# Patient Record
Sex: Female | Born: 1970 | Race: White | Hispanic: No | Marital: Single | Smoking: Former smoker
Health system: Southern US, Community
[De-identification: ages and names within clinical notes are randomized; demographics above are authoritative.]

## PROBLEM LIST (undated history)

## (undated) DIAGNOSIS — N959 Unspecified menopausal and perimenopausal disorder: Secondary | ICD-10-CM

## (undated) DIAGNOSIS — J329 Chronic sinusitis, unspecified: Secondary | ICD-10-CM

## (undated) DIAGNOSIS — G5620 Lesion of ulnar nerve, unspecified upper limb: Secondary | ICD-10-CM

## (undated) DIAGNOSIS — F419 Anxiety disorder, unspecified: Secondary | ICD-10-CM

## (undated) HISTORY — PX: TONSILLECTOMY AND ADENOIDECTOMY: SUR1326

## (undated) HISTORY — DX: Unspecified menopausal and perimenopausal disorder: N95.9

## (undated) HISTORY — DX: Lesion of ulnar nerve, unspecified upper limb: G56.20

## (undated) HISTORY — DX: Chronic sinusitis, unspecified: J32.9

## (undated) HISTORY — DX: Anxiety disorder, unspecified: F41.9

---

## 2000-01-10 ENCOUNTER — Inpatient Hospital Stay (HOSPITAL_COMMUNITY): Admission: AD | Admit: 2000-01-10 | Discharge: 2000-01-15 | Payer: Self-pay | Admitting: Obstetrics & Gynecology

## 2000-01-23 ENCOUNTER — Encounter: Admission: RE | Admit: 2000-01-23 | Discharge: 2000-03-23 | Payer: Self-pay | Admitting: Obstetrics and Gynecology

## 2000-02-12 ENCOUNTER — Other Ambulatory Visit: Admission: RE | Admit: 2000-02-12 | Discharge: 2000-02-12 | Payer: Self-pay | Admitting: Obstetrics and Gynecology

## 2002-09-03 ENCOUNTER — Other Ambulatory Visit: Admission: RE | Admit: 2002-09-03 | Discharge: 2002-09-03 | Payer: Self-pay | Admitting: Obstetrics and Gynecology

## 2003-04-11 ENCOUNTER — Inpatient Hospital Stay (HOSPITAL_COMMUNITY): Admission: RE | Admit: 2003-04-11 | Discharge: 2003-04-15 | Payer: Self-pay | Admitting: Obstetrics and Gynecology

## 2003-04-16 ENCOUNTER — Encounter: Admission: RE | Admit: 2003-04-16 | Discharge: 2003-05-16 | Payer: Self-pay | Admitting: Obstetrics and Gynecology

## 2003-05-14 ENCOUNTER — Other Ambulatory Visit: Admission: RE | Admit: 2003-05-14 | Discharge: 2003-05-14 | Payer: Self-pay | Admitting: Obstetrics and Gynecology

## 2006-10-20 DIAGNOSIS — Z8614 Personal history of Methicillin resistant Staphylococcus aureus infection: Secondary | ICD-10-CM | POA: Insufficient documentation

## 2009-12-11 ENCOUNTER — Ambulatory Visit: Payer: Self-pay

## 2014-11-19 ENCOUNTER — Other Ambulatory Visit: Payer: Self-pay | Admitting: Family Medicine

## 2014-11-19 DIAGNOSIS — F419 Anxiety disorder, unspecified: Secondary | ICD-10-CM | POA: Insufficient documentation

## 2014-11-19 MED ORDER — CLONAZEPAM 1 MG PO TABS: 1.0000 mg | ORAL_TABLET | Freq: Two times a day (BID) | ORAL | Status: DC

## 2014-11-19 NOTE — Telephone Encounter (Signed)
OK to call in rx. Thanks.  

## 2014-11-19 NOTE — Telephone Encounter (Signed)
Pt needs refill generic xaxan.  [facility].    Call back is [phone]  Thanks Barth Kirks

## 2014-11-19 NOTE — Telephone Encounter (Signed)
Pt is really taking Clonazepam .  Last OV 06/2014 with Nadine Counts  Thanks,   -Vernona Rieger

## 2014-12-11 DIAGNOSIS — G250 Essential tremor: Secondary | ICD-10-CM | POA: Insufficient documentation

## 2014-12-13 DIAGNOSIS — M542 Cervicalgia: Secondary | ICD-10-CM | POA: Insufficient documentation

## 2015-05-16 ENCOUNTER — Other Ambulatory Visit: Payer: Self-pay | Admitting: Family Medicine

## 2015-05-16 DIAGNOSIS — F419 Anxiety disorder, unspecified: Secondary | ICD-10-CM

## 2015-05-16 MED ORDER — CLONAZEPAM 1 MG PO TABS: 1.0000 mg | ORAL_TABLET | Freq: Two times a day (BID) | ORAL | Status: DC

## 2015-05-16 NOTE — Telephone Encounter (Signed)
Last OV 06/2014  Thanks,   -Claudia Moses  

## 2015-05-16 NOTE — Telephone Encounter (Signed)
Printed, please fax or call in to pharmacy. Thank you.   

## 2015-05-16 NOTE — Telephone Encounter (Signed)
Pt needs refill onazePAM (KLONOPIN) 1 MG tablet 11/19/14 -- Lorie PhenixNancy Maloney, MD   [facility] [address]  Her call back is [phone]  [facility]

## 2015-05-29 ENCOUNTER — Telehealth: Payer: Self-pay

## 2015-05-29 NOTE — Telephone Encounter (Signed)
Patient called saying that for the past 4-5 days, she has experienced heart palpitations that is becoming more frequent. Patient denies and chest pain, shortness of breath, dizziness, or lightheadedness. Patient reports that her symptoms come and go. She also reports that she has been out of her anxiety medication for over 2 weeks. She thinks that her symptoms could be anxiety related. Patient has scheduled an appt for tomorrow to be evaluated. Advised patient that if symptoms worsen, she would need to go to the ER. Patient verbalized understanding.

## 2015-05-30 ENCOUNTER — Ambulatory Visit (INDEPENDENT_AMBULATORY_CARE_PROVIDER_SITE_OTHER): Payer: Self-pay | Admitting: Family Medicine

## 2015-05-30 ENCOUNTER — Encounter: Payer: Self-pay | Admitting: Family Medicine

## 2015-05-30 VITALS — BP 96/60 | HR 68 | Temp 98.0°F | Resp 16 | Ht 62.5 in | Wt 135.6 lb

## 2015-05-30 DIAGNOSIS — G4700 Insomnia, unspecified: Secondary | ICD-10-CM

## 2015-05-30 DIAGNOSIS — R42 Dizziness and giddiness: Secondary | ICD-10-CM

## 2015-05-30 DIAGNOSIS — G5620 Lesion of ulnar nerve, unspecified upper limb: Secondary | ICD-10-CM | POA: Insufficient documentation

## 2015-05-30 DIAGNOSIS — N959 Unspecified menopausal and perimenopausal disorder: Secondary | ICD-10-CM | POA: Insufficient documentation

## 2015-05-30 DIAGNOSIS — J0100 Acute maxillary sinusitis, unspecified: Secondary | ICD-10-CM

## 2015-05-30 MED ORDER — DOXYCYCLINE HYCLATE 100 MG PO TABS: 100.0000 mg | ORAL_TABLET | Freq: Two times a day (BID) | ORAL | Status: DC

## 2015-05-30 MED ORDER — HYDROCODONE-HOMATROPINE 5-1.5 MG/5ML PO SYRP: ORAL_SOLUTION | ORAL | Status: DC

## 2015-05-30 NOTE — Addendum Note (Signed)
Addended by: Jaclyn PrimeHAUVIN, Kathee Tumlin S on: 05/30/2015 10:10 AM   Modules accepted: SmartSet

## 2015-05-30 NOTE — Patient Instructions (Addendum)
Stop Mucinex D and use plain Mucinex. Also may use Delsym for cough. For improved sleep: 1.Keep the same bedtime every night 2.Use your bed for sleep and sex only 3. Avoid exercise or stimulating electronic activity (video games/smart phones) prior to bedtime 4.Avoid alcohol or caffeine 2-3 hours before bedtime 5.Take a bath/shower prior to bedtime. This will help cool your body down and prepare it for sleep. 6.If you can't sleep, get up and do a sleep inducing activity like reading,

## 2015-05-30 NOTE — Progress Notes (Addendum)
Subjective:     Patient ID: Claudia Moses, female   DOB: [birth year], 45 y.o.   MRN: [id number]  HPI  Chief Complaint  Patient presents with  . Palpitations    Patient comes in office today with concerns of irregular heart beat for the past week. Patient states " it feel like heaviness in my chest then gets light, like Im standing on my head." Patient reports last episode happened yesteday while sitting, patient denies doing any strenous activity or being under any more stress than usual. Patient denies symptoms of shortness of breath or any past history of palpitations.    Marland Kitchen. URI    Patient has concerns of cold like symptoms for the past week. Patient reports that she has had congestion post nasal drip, ear congestion and sinus pain/pressure. Patient has been taking otc Mucinex and Ibuprofen for relief.   . Insomnia    Patient would like to address insomnia that she has been dealing with for the past 3-4years, she states that she takes Advil PM to help with sleep. Patient reports if she does not taking sleeping aid she averages 2-3hrs of sleep at night  Reports developing cold symptoms 7 days ago. States she had two days of feeling better then the sinus congestion returned along with PND and cough. Has been taking Mucinex D but denies palpitations. Reports that she has had seconds long episodes of feeling chest heaviness then light headed. Caffeine consumption is one cup of coffee daily with an occasional soda.  Review of Systems  Psychiatric/Behavioral: Positive for sleep disturbance (states she primarily has trouble getting to sleep. Denies rumination/anxiety states she will just toss and turn.).       Objective:   Physical Exam  Constitutional: She appears well-developed and well-nourished. No distress.  Ears: T.M's intact without inflammation Throat: tonsils absent Neck: no cervical adenopathy Lungs: clear Heart: RRR without murmur in the 60's(  Auscultated for one minute)      Assessment:    1. Acute maxillary sinusitis, recurrence not specified - doxycycline (VIBRA-TABS) 100 MG tablet; Take 1 tablet (100 mg total) by mouth 2 (two) times daily.  Dispense: 20 tablet; Refill: 0 - HYDROcodone-homatropine (HYCODAN) 5-1.5 MG/5ML syrup; 5 ml 4-6 hours as needed for cough  Dispense: 240 mL; Refill: 0  2. Light headed: ? Due to illness and/or decongestant medication  3. Insomnia    Plan:    Discussed sleep hygiene. Will consider medication at next office visit. Stop decongestants.

## 2015-06-13 ENCOUNTER — Ambulatory Visit (INDEPENDENT_AMBULATORY_CARE_PROVIDER_SITE_OTHER): Payer: Self-pay | Admitting: Family Medicine

## 2015-06-13 ENCOUNTER — Encounter: Payer: Self-pay | Admitting: Family Medicine

## 2015-06-13 VITALS — BP 96/68 | HR 58 | Temp 98.0°F | Resp 16 | Wt 133.4 lb

## 2015-06-13 DIAGNOSIS — F419 Anxiety disorder, unspecified: Secondary | ICD-10-CM

## 2015-06-13 DIAGNOSIS — N200 Calculus of kidney: Secondary | ICD-10-CM | POA: Insufficient documentation

## 2015-06-13 DIAGNOSIS — G4700 Insomnia, unspecified: Secondary | ICD-10-CM

## 2015-06-13 MED ORDER — NORTRIPTYLINE HCL 10 MG PO CAPS: ORAL_CAPSULE | ORAL | Status: DC

## 2015-06-13 NOTE — Patient Instructions (Addendum)
Discussed increased water intake on a daily basis. Phone follow up about how you are doing on the sleep medication. I will call you when I receive the emergency room records.

## 2015-06-13 NOTE — Progress Notes (Addendum)
Subjective:     Patient ID: Claudia BestLisa R Mclear, female   DOB: 08-11-1970, 45 y.o.   MRN: [id number]  HPI  Chief Complaint  Patient presents with  . Insomnia    Patient returns to office for 2 week follow up, patient was last seen 05/30/15. At last visit we discussed sleep hygeine, patient reports that she has been taking Advil PM and that since last visit there has been no improvement with sleep cycle.   States she continues to have trouble getting to sleep. Since we last saw her she has passed a right kidney stone while on vacation. [facility] [place] Was informed that she had 3 other stones in her kidney. Placed on oxycodone and Flomax. Passed stone was 3mm. No prior hx of kidney stones.   Review of Systems  HENT:       Sinus infection has cleared from prior office visit.       Objective:   Physical Exam  Constitutional: She appears well-developed and well-nourished. No distress.       Assessment:    1. Bilateral kidney stones: Acquire hospital records  2. Insomnia - nortriptyline (PAMELOR) 10 MG capsule; One to three at bedtime  Dispense: 30 capsule; Refill: 0    Plan:    Phone f/u regarding sleep medication. Further f/u after hospital records reviewed. Urine strainer provided.

## 2015-10-16 ENCOUNTER — Other Ambulatory Visit: Payer: Self-pay | Admitting: Family Medicine

## 2015-10-16 DIAGNOSIS — F419 Anxiety disorder, unspecified: Secondary | ICD-10-CM

## 2015-10-16 MED ORDER — CLONAZEPAM 1 MG PO TABS: 1.0000 mg | ORAL_TABLET | Freq: Two times a day (BID) | ORAL | 1 refills | Status: DC

## 2015-10-16 NOTE — Telephone Encounter (Signed)
Pt contacted office for refill request on the following medications: clonazePAM (KLONOPIN) 1 MG tablet  [facility] [address] Last written: 05/16/15 Last OV: 06/13/15 (with Nadine CountsBob) Pt was seeing Dr. Elease HashimotoMaloney but since she has seen Nadine CountsBob in the past she requested the message be sent to [facility]. Please advise. Thanks TNP

## 2015-10-16 NOTE — Telephone Encounter (Signed)
Last refill was 05/16/2015. Allene DillonEmily Drozdowski, CMA

## 2015-10-16 NOTE — Telephone Encounter (Signed)
Called in. Emily Drozdowski, CMA  

## 2016-01-14 ENCOUNTER — Encounter: Payer: Self-pay | Admitting: Family Medicine

## 2016-01-14 ENCOUNTER — Ambulatory Visit (INDEPENDENT_AMBULATORY_CARE_PROVIDER_SITE_OTHER): Payer: Self-pay | Admitting: Family Medicine

## 2016-01-14 ENCOUNTER — Telehealth: Payer: Self-pay | Admitting: Family Medicine

## 2016-01-14 VITALS — BP 96/58 | HR 68 | Temp 98.3°F | Resp 16 | Wt 142.0 lb

## 2016-01-14 DIAGNOSIS — J40 Bronchitis, not specified as acute or chronic: Secondary | ICD-10-CM

## 2016-01-14 MED ORDER — AZITHROMYCIN 250 MG PO TABS: ORAL_TABLET | ORAL | 0 refills | Status: DC

## 2016-01-14 MED ORDER — HYDROCODONE-HOMATROPINE 5-1.5 MG/5ML PO SYRP: ORAL_SOLUTION | ORAL | 0 refills | Status: DC

## 2016-01-14 NOTE — Telephone Encounter (Signed)
Pt stated she found the Rx it had fallen in between her seats of her car. Thanks TNP

## 2016-01-14 NOTE — Patient Instructions (Signed)
Let me know if you are not improving with treatment.

## 2016-01-14 NOTE — Telephone Encounter (Signed)
Please review-aa 

## 2016-01-14 NOTE — Telephone Encounter (Signed)
Pt stated that when she was in the office this morning and saw Nadine CountsBob she was given an Rx for HYDROcodone-homatropine (HYCODAN) 5-1.5 MG/5ML syrup. Pt stated [facility] her other medication that was called in and she couldn't find this Rx to get filled. Pt stated that the last time she remembered having it was at check out. I didn't see the Rx up front. Pt request that the Rx be reprinted and she would call back if she found it. Pt was advised that Nadine CountsBob has already left for the day and we would send the request to another provider in the office. Please advise. Thanks TNP

## 2016-01-14 NOTE — Progress Notes (Signed)
Subjective:     Patient ID: Claudia BestLisa R Moses, female   DOB: [birth year], 45 y.o.   MRN: [id number]  HPI  Chief Complaint  Patient presents with  . Cough    x 2-3 weeks. Dry cough. Started with sinus infection, which has improved but now pt is experiencing lingering cough. Is also c/o chest tightness and nausea. Afebrile.  States cough is minimally productive and worse in the AM. Reports paroxysms of coughing.   Review of Systems  Constitutional: Negative for chills and fever.      Objective:   Physical Exam  Constitutional: She appears well-developed and well-nourished. No distress.  Ears: T.M's intact without inflammation Throat: tonsils absent Neck: no cervical adenopathy Lungs: clear     Assessment:    1. Bronchitis - HYDROcodone-homatropine (HYCODAN) 5-1.5 MG/5ML syrup; 5 ml 4-6 hours as needed for cough  Dispense: 240 mL; Refill: 0 - azithromycin (ZITHROMAX Z-PAK) 250 MG tablet; Take two pills the first day then one pill daily  Dispense: 6 each; Refill: 0    Plan:    Further f/u if not improving. Discussed getting Tdap and flu vaccine when better

## 2016-04-09 ENCOUNTER — Telehealth: Payer: Self-pay | Admitting: Family Medicine

## 2016-04-09 ENCOUNTER — Other Ambulatory Visit: Payer: Self-pay | Admitting: Family Medicine

## 2016-04-09 DIAGNOSIS — F419 Anxiety disorder, unspecified: Secondary | ICD-10-CM

## 2016-04-09 MED ORDER — CLONAZEPAM 1 MG PO TABS: 1.0000 mg | ORAL_TABLET | Freq: Two times a day (BID) | ORAL | 1 refills | Status: DC

## 2016-04-09 NOTE — Progress Notes (Signed)
rx called in-aa 

## 2016-04-09 NOTE — Telephone Encounter (Signed)
Please review-aa 

## 2016-04-09 NOTE — Telephone Encounter (Signed)
Pt needs refill on her   clonazePAM (KLONOPIN) 1 MG tablet  Taking 10/16/15 -- Anola Gurneyobert Chauvin, PA    Take 1 tablet (1 mg total) by mouth 2 (two) times      [facility] [address]  Thanks Barth Kirkseri

## 2016-04-09 NOTE — Telephone Encounter (Signed)
Clonazepam refilled 

## 2016-08-12 ENCOUNTER — Telehealth: Payer: Self-pay | Admitting: *Deleted

## 2016-08-12 NOTE — Telephone Encounter (Signed)
Pt states she thinks she has a toenail fungus and wants to know how to treat. I told pt the best way to find out how to treat toenail fungus is to find out what kind of fungus needs treating. Pt states understanding and I transferred to schedulers.

## 2016-08-23 ENCOUNTER — Other Ambulatory Visit: Payer: Self-pay | Admitting: Family Medicine

## 2016-08-23 DIAGNOSIS — F419 Anxiety disorder, unspecified: Secondary | ICD-10-CM

## 2016-08-23 MED ORDER — CLONAZEPAM 1 MG PO TABS: 1.0000 mg | ORAL_TABLET | Freq: Two times a day (BID) | ORAL | 1 refills | Status: DC

## 2016-08-23 NOTE — Telephone Encounter (Signed)
rx called in-aa 

## 2016-08-23 NOTE — Telephone Encounter (Signed)
Please call in clonazepam as requested with one refill.

## 2016-08-23 NOTE — Telephone Encounter (Signed)
Pt contacted office for refill request on the following medications:  clonazePAM (KLONOPIN) 1 MG tablet.  [facility] [address]  [phone]#[phone]/MW

## 2016-10-07 ENCOUNTER — Encounter: Payer: Self-pay | Admitting: Physician Assistant

## 2016-10-11 ENCOUNTER — Ambulatory Visit (INDEPENDENT_AMBULATORY_CARE_PROVIDER_SITE_OTHER): Payer: Self-pay | Admitting: Physician Assistant

## 2016-10-11 ENCOUNTER — Encounter: Payer: Self-pay | Admitting: Physician Assistant

## 2016-10-11 VITALS — BP 92/62 | HR 70 | Temp 98.2°F | Wt 137.6 lb

## 2016-10-11 DIAGNOSIS — K219 Gastro-esophageal reflux disease without esophagitis: Secondary | ICD-10-CM

## 2016-10-11 DIAGNOSIS — Z1329 Encounter for screening for other suspected endocrine disorder: Secondary | ICD-10-CM

## 2016-10-11 DIAGNOSIS — Z0000 Encounter for general adult medical examination without abnormal findings: Secondary | ICD-10-CM

## 2016-10-11 DIAGNOSIS — Z13220 Encounter for screening for lipoid disorders: Secondary | ICD-10-CM

## 2016-10-11 DIAGNOSIS — Z131 Encounter for screening for diabetes mellitus: Secondary | ICD-10-CM

## 2016-10-11 DIAGNOSIS — Z1239 Encounter for other screening for malignant neoplasm of breast: Secondary | ICD-10-CM

## 2016-10-11 DIAGNOSIS — Z124 Encounter for screening for malignant neoplasm of cervix: Secondary | ICD-10-CM

## 2016-10-11 DIAGNOSIS — Z23 Encounter for immunization: Secondary | ICD-10-CM

## 2016-10-11 MED ORDER — OMEPRAZOLE 20 MG PO CPDR: 20.0000 mg | DELAYED_RELEASE_CAPSULE | Freq: Every day | ORAL | 3 refills | Status: DC

## 2016-10-11 NOTE — Progress Notes (Signed)
Patient: Claudia Moses, Female    DOB: 03-05-1970, 46 y.o.   MRN: [id number] Visit Date: 10/11/2016  Today's Provider: Trey Sailors, PA-C   Chief Complaint  Patient presents with  . Annual Exam   Subjective:    Annual physical exam Claudia Moses is a 46 y.o. female who presents today for health maintenance and complete physical. She feels well. She reports exercising 4 times per week. She reports she is sleeping poorly.  She has been uninsured for the past several years and so this is her first physical in 4 years.   Last mammogram 5 years ago with gynecologist, normal per patient. Used to see gynecology, last saw gynecologist 5 years ago when she went through menopause. Has a history of precancerous cervical cells that were removed with cone biopsy when she was 21. No abnormals since then. Denies vaginal bleeding.  She lives in [place]. 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 Has two children ages 66 and 62. In a relationship with a female, sexually active, no protection,  Not concerned for STI.   She has a history of anxiety, takes klonopin every other or every 3 days for this.  She also is reporting some throat tightness that happened 3-4 times in the past month. She reports pain in her throat that occurs suddenly and can last minutes to hours. She denies chest pain or breathing problems. This pain does not occur with exertion, but randomly. A choking sensation, but does not actually choke. No problems eating solids or liquids. No history of thyroid disorder or thyromegaly.  Dad recently underwent quadruple bypass, recovering from this.  No history of breast cancer or colon cancer in family. -----------------------------------------------------------------   Review of Systems  Constitutional: Positive for diaphoresis.  HENT: Positive for sinus pressure and trouble swallowing (pt states it feels like a golf ball is stuck in throat).   Eyes: Negative.     Respiratory: Positive for chest tightness.   Cardiovascular: Negative.   Gastrointestinal: Negative.   Endocrine: Negative.   Genitourinary: Negative.   Musculoskeletal: Positive for neck stiffness.  Skin: Negative.   Allergic/Immunologic: Negative.   Neurological: Positive for numbness (fingers ).  Hematological: Negative.   Psychiatric/Behavioral: Positive for decreased concentration and sleep disturbance. The patient is nervous/anxious.     Social History      She  reports that she has quit smoking. She has never used smokeless tobacco. She reports that she drinks alcohol. She reports that she does not use drugs.       Social History   Social History  . Marital status: Single    Spouse name: N/A  . Number of children: N/A  . Years of education: N/A   Social History Main Topics  . Smoking status: Former Games developer  . Smokeless tobacco: Never Used     Comment: quit in 2000  . Alcohol use 0.0 oz/week     Comment: occasional  . Drug use: No  . Sexual activity: Not Asked   Other Topics Concern  . None   Social History Narrative  . None    Past Medical History:  Diagnosis Date  . Anxiety   . Chronic sinusitis   . Menopausal disorder   . Ulnar tunnel syndrome      Patient Active Problem List   Diagnosis Date Noted  . Bilateral kidney stones 06/13/2015  . Acute anxiety 06/13/2015  . Cervical pain (neck) 12/13/2014  . History of  MRSA infection 10/20/2006    Past Surgical History:  Procedure Laterality Date  . CESAREAN SECTION  2001,2005  . TONSILLECTOMY AND ADENOIDECTOMY      Family History        Family Status  Relation Status  . Mother Alive  . Father Alive  . Brother Alive  . PGM Alive  . Brother Alive        Her family history includes Cancer in her paternal grandmother; Hypertension in her father and mother.     Allergies  Allergen Reactions  . Amoxicillin-Pot Clavulanate   . Promethazine      Current Outpatient Prescriptions:  .   clonazePAM (KLONOPIN) 1 MG tablet, Take 1 tablet (1 mg total) by mouth 2 (two) times daily., Disp: 60 tablet, Rfl: 1   Patient Care Team: Anola Gurney, [place] as PCP - General (Family Medicine)      Objective:   Vitals: BP 92/62 (BP Location: Left Arm, Patient Position: Sitting, Cuff Size: Normal)   Pulse 70   Temp 98.2 F (36.8 C) (Oral)   Wt 137 lb 9.6 oz (62.4 kg)   LMP  (LMP Unknown)   SpO2 98%   BMI 24.77 kg/m    Vitals:   10/11/16 1407  BP: 92/62  Pulse: 70  Temp: 98.2 F (36.8 C)  TempSrc: Oral  SpO2: 98%  Weight: 137 lb 9.6 oz (62.4 kg)     Physical Exam  Constitutional: She is oriented to person, place, and time. She appears well-developed and well-nourished.  HENT:  Right Ear: Tympanic membrane and external ear normal.  Left Ear: Tympanic membrane and external ear normal.  Mouth/Throat: Oropharynx is clear and moist. No oropharyngeal exudate.  Eyes: Conjunctivae are normal.  Neck: Neck supple. No thyromegaly present.  Cardiovascular: Normal rate and regular rhythm.   Pulmonary/Chest: Effort normal and breath sounds normal. Right breast exhibits inverted nipple. Right breast exhibits no mass, no nipple discharge, no skin change and no tenderness. Left breast exhibits inverted nipple. Left breast exhibits no mass, no nipple discharge, no skin change and no tenderness. Breasts are symmetrical.  Abdominal: Soft. Bowel sounds are normal. She exhibits no distension. There is no tenderness. There is no rebound.  Genitourinary: There is no rash, tenderness, lesion or injury on the right labia. There is no rash, tenderness, lesion or injury on the left labia. Uterus is not deviated, not enlarged, not fixed and not tender. Cervix exhibits no motion tenderness, no discharge and no friability. Right adnexum displays no mass, no tenderness and no fullness. Left adnexum displays no mass, no tenderness and no fullness. No erythema, tenderness or bleeding in the vagina. No foreign  body in the vagina. No signs of injury around the vagina. No vaginal discharge found.  Lymphadenopathy:    She has no cervical adenopathy.  Neurological: She is alert and oriented to person, place, and time.  Skin: Skin is warm and dry.  Psychiatric: She has a normal mood and affect. Her behavior is normal.     Depression Screen PHQ 2/9 Scores 10/11/2016  PHQ - 2 Score 0      Assessment & Plan:     Routine Health Maintenance and Physical Exam  Exercise Activities and Dietary recommendations Goals    None      Immunization History  Administered Date(s) Administered  . Influenza Split 11/27/2009, 03/05/2011    Health Maintenance  Topic Date Due  . HIV Screening  06/08/1985  . TETANUS/TDAP  06/08/1989  . PAP SMEAR  06/09/1991  . INFLUENZA VACCINE  09/22/2016     Discussed health benefits of physical activity, and encouraged her to engage in regular exercise appropriate for her age and condition.    1. Annual physical exam  - CBC with Differential  2. Cervical cancer screening  - Pap IG and HPV (high risk) DNA detection  3. Thyroid disorder screening  - TSH  4. Lipid screening  - Lipid Profile  5. Diabetes mellitus screening  - Comprehensive Metabolic Panel (CMET)  6. Need for tetanus, diphtheria, and acellular pertussis (Tdap) vaccine  Advised to get at [facility] Department as she is self pay.  7. Breast cancer screening  Directed her towards [facility] mobile mammography. She can have one here if she wants to pay out of pocket.  8. Gastroesophageal reflux disease, esophagitis presence not specified  If not responding to max dose or worsening to dysphagia, will refer to GI.  - omeprazole (PRILOSEC) 20 MG capsule; Take 1 capsule (20 mg total) by mouth daily.  Dispense: 30 capsule; Refill: 3  Return in about 1 year (around 10/11/2017) for CPE.  The entirety of the information documented in the History of Present Illness, Review of Systems and Physical  Exam were personally obtained by me. Portions of this information were initially documented by Avie Arenas and reviewed by me for thoroughness and accuracy.      --------------------------------------------------------------------    Trey Sailors, PA-C  [facility]

## 2016-10-11 NOTE — Patient Instructions (Signed)
[place] Maintenance, Female Adopting a healthy lifestyle and getting preventive care can go a long way to promote health and wellness. Talk with your health care provider about what schedule of regular examinations is right for you. This is a good chance for you to check in with your provider about disease prevention and staying healthy. In between checkups, there are plenty of things you can do on your own. Experts have done a lot of research about which lifestyle changes and preventive measures are most likely to keep you healthy. Ask your health care provider for more information. Weight and diet Eat a healthy diet  Be sure to include plenty of vegetables, fruits, low-fat dairy products, and lean protein.  Do not eat a lot of foods high in solid fats, added sugars, or salt.  Get regular exercise. This is one of the most important things you can do for your health. ? Most adults should exercise for at least 150 minutes each week. The exercise should increase your heart rate and make you sweat (moderate-intensity exercise). ? Most adults should also do strengthening exercises at least twice a week. This is in addition to the moderate-intensity exercise.  Maintain a healthy weight  Body mass index (BMI) is a measurement that can be used to identify possible weight problems. It estimates body fat based on height and weight. Your health care provider can help determine your BMI and help you achieve or maintain a healthy weight.  For females 65 years of age and older: ? A BMI below 18.5 is considered underweight. ? A BMI of 18.5 to 24.9 is normal. ? A BMI of 25 to 29.9 is considered overweight. ? A BMI of 30 and above is considered obese.  Watch levels of cholesterol and blood lipids  You should start having your blood tested for lipids and cholesterol at 46 years of age, then have this test every 5 years.  You may need to have your cholesterol levels  checked more often if: ? Your lipid or cholesterol levels are high. ? You are older than 46 years of age. ? You are at high risk for heart disease.  Cancer screening Lung Cancer  Lung cancer screening is recommended for adults 26-26 years old who are at high risk for lung cancer because of a history of smoking.  A yearly low-dose CT scan of the lungs is recommended for people who: ? Currently smoke. ? Have quit within the past 15 years. ? Have at least a 30-pack-year history of smoking. A pack year is smoking an average of one pack of cigarettes a day for 1 year.  Yearly screening should continue until it has been 15 years since you quit.  Yearly screening should stop if you develop a health problem that would prevent you from having lung cancer treatment.  Breast Cancer  Practice breast self-awareness. This means understanding how your breasts normally appear and feel.  It also means doing regular breast self-exams. Let your health care provider know about any changes, no matter how small.  If you are in your 20s or 30s, you should have a clinical breast exam (CBE) by a health care provider every 1-3 years as part of a regular health exam.  If you are 48 or older, have a CBE every year. Also consider having a breast X-ray (mammogram) every year.  If you have a family history of breast cancer, talk to your health care provider about genetic  screening.  If you are at high risk for breast cancer, talk to your health care provider about having an MRI and a mammogram every year.  Breast cancer gene (BRCA) assessment is recommended for women who have family members with BRCA-related cancers. BRCA-related cancers include: ? Breast. ? Ovarian. ? Tubal. ? Peritoneal cancers.  Results of the assessment will determine the need for genetic counseling and BRCA1 and BRCA2 testing.  Cervical Cancer Your health care provider may recommend that you be screened regularly for cancer of the  pelvic organs (ovaries, uterus, and vagina). This screening involves a pelvic examination, including checking for microscopic changes to the surface of your cervix (Pap test). You may be encouraged to have this screening done every 3 years, beginning at age 37.  For women ages 72-65, health care providers may recommend pelvic exams and Pap testing every 3 years, or they may recommend the Pap and pelvic exam, combined with testing for human papilloma virus (HPV), every 5 years. Some types of HPV increase your risk of cervical cancer. Testing for HPV may also be done on women of any age with unclear Pap test results.  Other health care providers may not recommend any screening for nonpregnant women who are considered low risk for pelvic cancer and who do not have symptoms. Ask your health care provider if a screening pelvic exam is right for you.  If you have had past treatment for cervical cancer or a condition that could lead to cancer, you need Pap tests and screening for cancer for at least 20 years after your treatment. If Pap tests have been discontinued, your risk factors (such as having a new sexual partner) need to be reassessed to determine if screening should resume. Some women have medical problems that increase the chance of getting cervical cancer. In these cases, your health care provider may recommend more frequent screening and Pap tests.  Colorectal Cancer  This type of cancer can be detected and often prevented.  Routine colorectal cancer screening usually begins at 46 years of age and continues through 46 years of age.  Your health care provider may recommend screening at an earlier age if you have risk factors for colon cancer.  Your health care provider may also recommend using home test kits to check for hidden blood in the stool.  A small camera at the end of a tube can be used to examine your colon directly (sigmoidoscopy or colonoscopy). This is done to check for the  earliest forms of colorectal cancer.  Routine screening usually begins at age 21.  Direct examination of the colon should be repeated every 5-10 years through 46 years of age. However, you may need to be screened more often if early forms of precancerous polyps or small growths are found.  Skin Cancer  Check your skin from head to toe regularly.  Tell your health care provider about any new moles or changes in moles, especially if there is a change in a mole's shape or color.  Also tell your health care provider if you have a mole that is larger than the size of a pencil eraser.  Always use sunscreen. Apply sunscreen liberally and repeatedly throughout the day.  Protect yourself by wearing long sleeves, pants, a wide-brimmed hat, and sunglasses whenever you are outside.  Heart disease, diabetes, and high blood pressure  High blood pressure causes heart disease and increases the risk of stroke. High blood pressure is more likely to develop in: ? People who  have blood pressure in the high end of the normal range (130-139/85-89 mm Hg). ? People who are overweight or obese. ? People who are African American.  If you are 49-67 years of age, have your blood pressure checked every 3-5 years. If you are 29 years of age or older, have your blood pressure checked every year. You should have your blood pressure measured twice-once when you are at a hospital or clinic, and once when you are not at a hospital or clinic. Record the average of the two measurements. To check your blood pressure when you are not at a hospital or clinic, you can use: ? An automated blood pressure machine at a pharmacy. ? A home blood pressure monitor.  If you are between 16 years and 51 years old, ask your health care provider if you should take aspirin to prevent strokes.  Have regular diabetes screenings. This involves taking a blood sample to check your fasting blood sugar level. ? If you are at a normal weight and  have a low risk for diabetes, have this test once every three years after 46 years of age. ? If you are overweight and have a high risk for diabetes, consider being tested at a younger age or more often. Preventing infection Hepatitis B  If you have a higher risk for hepatitis B, you should be screened for this virus. You are considered at high risk for hepatitis B if: ? You were born in a country where hepatitis B is common. Ask your health care provider which countries are considered high risk. ? Your parents were born in a high-risk country, and you have not been immunized against hepatitis B (hepatitis B vaccine). ? You have HIV or AIDS. ? You use needles to inject street drugs. ? You live with someone who has hepatitis B. ? You have had sex with someone who has hepatitis B. ? You get hemodialysis treatment. ? You take certain medicines for conditions, including cancer, organ transplantation, and autoimmune conditions.  Hepatitis C  Blood testing is recommended for: ? Everyone born from [birth year] through 1965. ? Anyone with known risk factors for hepatitis C.  Sexually transmitted infections (STIs)  You should be screened for sexually transmitted infections (STIs) including gonorrhea and chlamydia if: ? You are sexually active and are younger than 46 years of age. ? You are older than 46 years of age and your health care provider tells you that you are at risk for this type of infection. ? Your sexual activity has changed since you were last screened and you are at an increased risk for chlamydia or gonorrhea. Ask your health care provider if you are at risk.  If you do not have HIV, but are at risk, it may be recommended that you take a prescription medicine daily to prevent HIV infection. This is called pre-exposure prophylaxis (PrEP). You are considered at risk if: ? You are sexually active and do not regularly use condoms or know the HIV status of your partner(s). ? You take drugs by  injection. ? You are sexually active with a partner who has HIV.  Talk with your health care provider about whether you are at high risk of being infected with HIV. If you choose to begin PrEP, you should first be tested for HIV. You should then be tested every 3 months for as long as you are taking PrEP. Pregnancy  If you are premenopausal and you may become pregnant, ask your health care provider  about preconception counseling.  If you may become pregnant, take 400 to 800 micrograms (mcg) of folic acid every day.  If you want to prevent pregnancy, talk to your health care provider about birth control (contraception). Osteoporosis and menopause  Osteoporosis is a disease in which the bones lose minerals and strength with aging. This can result in serious bone fractures. Your risk for osteoporosis can be identified using a bone density scan.  If you are 6 years of age or older, or if you are at risk for osteoporosis and fractures, ask your health care provider if you should be screened.  Ask your health care provider whether you should take a calcium or vitamin D supplement to lower your risk for osteoporosis.  Menopause may have certain physical symptoms and risks.  Hormone replacement therapy may reduce some of these symptoms and risks. Talk to your health care provider about whether hormone replacement therapy is right for you. Follow these instructions at home:  Schedule regular health, dental, and eye exams.  Stay current with your immunizations.  Do not use any tobacco products including cigarettes, chewing tobacco, or electronic cigarettes.  If you are pregnant, do not drink alcohol.  If you are breastfeeding, limit how much and how often you drink alcohol.  Limit alcohol intake to no more than 1 drink per day for nonpregnant women. One drink equals 12 ounces of beer, 5 ounces of wine, or 1 ounces of hard liquor.  Do not use street drugs.  Do not share needles.  Ask  your health care provider for help if you need support or information about quitting drugs.  Tell your health care provider if you often feel depressed.  Tell your health care provider if you have ever been abused or do not feel safe at home. This information is not intended to replace advice given to you by your health care provider. Make sure you discuss any questions you have with your health care provider. Document Released: 08/24/2010 Document Revised: 07/17/2015 Document Reviewed: 11/12/2014 Elsevier Interactive Patient Education  [employer].

## 2016-10-12 LAB — CBC WITH DIFFERENTIAL/PLATELET
Basophils Absolute: 0 10*3/uL (ref 0.0–0.2)
Basos: 0 %
EOS (ABSOLUTE): 0.1 10*3/uL (ref 0.0–0.4)
Eos: 2 %
Hematocrit: 40.7 % (ref 34.0–46.6)
Hemoglobin: 14.3 g/dL (ref 11.1–15.9)
Immature Grans (Abs): 0 10*3/uL (ref 0.0–0.1)
Immature Granulocytes: 0 %
Lymphocytes Absolute: 1.6 10*3/uL (ref 0.7–3.1)
Lymphs: 23 %
MCH: 31.1 pg (ref 26.6–33.0)
MCHC: 35.1 g/dL (ref 31.5–35.7)
MCV: 89 fL (ref 79–97)
Monocytes Absolute: 0.5 10*3/uL (ref 0.1–0.9)
Monocytes: 7 %
Neutrophils Absolute: 4.7 10*3/uL (ref 1.4–7.0)
Neutrophils: 68 %
Platelets: 284 10*3/uL (ref 150–379)
RBC: 4.6 x10E6/uL (ref 3.77–5.28)
RDW: 13.6 % (ref 12.3–15.4)
WBC: 6.9 10*3/uL (ref 3.4–10.8)

## 2016-10-12 LAB — COMPREHENSIVE METABOLIC PANEL
ALT: 12 IU/L (ref 0–32)
AST: 17 IU/L (ref 0–40)
Albumin/Globulin Ratio: 1.8 (ref 1.2–2.2)
Albumin: 4.8 g/dL (ref 3.5–5.5)
Alkaline Phosphatase: 73 IU/L (ref 39–117)
BUN/Creatinine Ratio: 14 (ref 9–23)
BUN: 11 mg/dL (ref 6–24)
Bilirubin Total: 0.6 mg/dL (ref 0.0–1.2)
CO2: 23 mmol/L (ref 20–29)
Calcium: 10 mg/dL (ref 8.7–10.2)
Chloride: 102 mmol/L (ref 96–106)
Creatinine, Ser: 0.79 mg/dL (ref 0.57–1.00)
GFR calc Af Amer: 104 mL/min/{1.73_m2} (ref >59)
GFR calc non Af Amer: 90 mL/min/{1.73_m2} (ref >59)
Globulin, Total: 2.7 g/dL (ref 1.5–4.5)
Glucose: 81 mg/dL (ref 65–99)
Potassium: 4.4 mmol/L (ref 3.5–5.2)
Sodium: 141 mmol/L (ref 134–144)
Total Protein: 7.5 g/dL (ref 6.0–8.5)

## 2016-10-12 LAB — LIPID PANEL
Chol/HDL Ratio: 2.3 ratio (ref 0.0–4.4)
Cholesterol, Total: 217 mg/dL — ABNORMAL HIGH (ref 100–199)
HDL: 93 mg/dL (ref >39)
LDL Calculated: 114 mg/dL — ABNORMAL HIGH (ref 0–99)
Triglycerides: 49 mg/dL (ref 0–149)
VLDL Cholesterol Cal: 10 mg/dL (ref 5–40)

## 2016-10-12 LAB — TSH: TSH: 1.31 u[IU]/mL (ref 0.450–4.500)

## 2016-10-13 LAB — PAP IG AND HPV HIGH-RISK
HPV, high-risk: NEGATIVE
PAP Smear Comment: 0

## 2016-10-20 ENCOUNTER — Telehealth: Payer: Self-pay | Admitting: Family Medicine

## 2016-10-20 NOTE — Telephone Encounter (Signed)
She can increase omeprazole to 20 mg twice daily. This medication takes around two weeks to work, so 8 days without effect is not a failure just yet. If no effect from this, we can consider starting an SSRI for any anxiety component and see her back in the clinic. I can send her to GI and they might scope her, but we can try these things first.

## 2016-10-20 NOTE — Telephone Encounter (Signed)
Pt called saying she is still having the sensation of some thing caught in her throat.  She has been taking the Prilosec for 8 day and other OTC acid medication.  This has not helped.  Please advise.  She does not have insurance.  She said you mentioned her seeing a specialist  Thanks teri.

## 2016-10-20 NOTE — Telephone Encounter (Signed)
Patient advised. She verbalized understanding. She will call back in 2 weeks to give a update on symptoms.

## 2017-03-08 ENCOUNTER — Encounter: Payer: Self-pay | Admitting: Family Medicine

## 2017-03-08 ENCOUNTER — Ambulatory Visit: Payer: Self-pay | Admitting: Family Medicine

## 2017-03-08 VITALS — BP 104/74 | HR 75 | Temp 98.1°F | Resp 16 | Wt 147.8 lb

## 2017-03-08 DIAGNOSIS — J069 Acute upper respiratory infection, unspecified: Secondary | ICD-10-CM

## 2017-03-08 DIAGNOSIS — F419 Anxiety disorder, unspecified: Secondary | ICD-10-CM

## 2017-03-08 MED ORDER — CLONAZEPAM 1 MG PO TABS: 1.0000 mg | ORAL_TABLET | Freq: Two times a day (BID) | ORAL | 1 refills | Status: DC

## 2017-03-08 MED ORDER — HYDROCODONE-HOMATROPINE 5-1.5 MG/5ML PO SYRP: ORAL_SOLUTION | ORAL | 0 refills | Status: DC

## 2017-03-08 NOTE — Progress Notes (Signed)
Subjective:     Patient ID: Claudia BestLisa R Moses, female   DOB: 05/19/1970, 47 y.o.   MRN: [id number] Chief Complaint  Patient presents with  . Cough    Patient comes in ofifce today with concerns of cough and congestion for the past 7 days. Patient reports losing her voice, difficulty sleeping at night, pressure in both ears, visual chanes, sinus pain/pressure, headache and wheezing. Patient has tried otc Mucinex, Ibuprofen, Nyquil an Dayquil.    HPI Reports onset of cold sx 8 days ago. States sinuses have just started to feel better with residual clear drainage. States cough has just begun and is keeping her up at night. Also wishes refill on clonazepam which she uses sporadically (once a week) for panic attack sx.  Review of Systems     Objective:   Physical Exam  Constitutional: She appears well-developed and well-nourished. No distress.  Ears: T.M's intact without inflammation Sinuses: non-tender Throat: tonsils absent/no posterior pharyngeal erythema Neck: no cervical adenopathy Lungs: clear, deep inspiration provokes dry cough, no wheezing.     Assessment:    1. Viral upper respiratory tract infection - HYDROcodone-homatropine (HYCODAN) 5-1.5 MG/5ML syrup; 5 ml 4-6 hours as needed for cough  Dispense: 120 mL; Refill:   2 Anxiety - clonazePAM (KLONOPIN) 1 MG tablet; Take 1 tablet (1 mg total) by mouth 2 (two) times daily.  Dispense: 60 tablet; Refill: 1    Plan:    Discussed otc medication. Cautioned to not take hydrocodone with clonazepam.

## 2017-03-08 NOTE — Patient Instructions (Signed)
Continue Mucinex /saline nasal spray and add Sudafed PE. May also try Delsym for cough.

## 2017-05-31 ENCOUNTER — Ambulatory Visit (INDEPENDENT_AMBULATORY_CARE_PROVIDER_SITE_OTHER): Payer: Self-pay | Admitting: Physician Assistant

## 2017-05-31 ENCOUNTER — Encounter: Payer: Self-pay | Admitting: Physician Assistant

## 2017-05-31 VITALS — BP 104/78 | HR 64 | Temp 98.5°F | Resp 16 | Wt 142.0 lb

## 2017-05-31 DIAGNOSIS — R42 Dizziness and giddiness: Secondary | ICD-10-CM

## 2017-05-31 DIAGNOSIS — N898 Other specified noninflammatory disorders of vagina: Secondary | ICD-10-CM

## 2017-05-31 MED ORDER — FLUCONAZOLE 150 MG PO TABS
ORAL_TABLET | ORAL | 0 refills | Status: DC
Start: 2017-05-31 — End: 2017-06-03

## 2017-05-31 MED ORDER — MECLIZINE HCL 12.5 MG PO TABS: 12.5000 mg | ORAL_TABLET | Freq: Three times a day (TID) | ORAL | 0 refills | Status: DC | PRN

## 2017-05-31 NOTE — Progress Notes (Signed)
Patient: Claudia BestLisa R Moses Female    DOB: 08/31/1970   47 y.o.   MRN: [id number] Visit Date: 05/31/2017  Today's Provider: Trey SailorsAdriana M Jsaon Yoo, PA-C   Chief Complaint  Patient presents with  . Vaginal Discharge    Started about a week ago.   Subjective:    Claudia Moses is a 47 y/o woman presenting today for vaginal discharge, also vertigo follow up.  Vaginal Discharge  The patient's primary symptoms include genital itching and vaginal discharge. The patient's pertinent negatives include no genital lesions, genital odor, genital rash, missed menses, pelvic pain or vaginal bleeding. This is a new problem. The current episode started in the past 7 days. The problem has been unchanged. Pertinent negatives include no dysuria, flank pain, frequency, headaches, hematuria or urgency.   Reports she has had episode of vertigo in the past that were relieved with meclizine and she is requesting refill of this due to recent episodes.      Allergies  Allergen Reactions  . Amoxicillin-Pot Clavulanate   . Promethazine      Current Outpatient Medications:  .  clonazePAM (KLONOPIN) 1 MG tablet, Take 1 tablet (1 mg total) by mouth 2 (two) times daily., Disp: 60 tablet, Rfl: 1 .  HYDROcodone-homatropine (HYCODAN) 5-1.5 MG/5ML syrup, 5 ml 4-6 hours as needed for cough, Disp: 120 mL, Rfl: 0 .  omeprazole (PRILOSEC) 20 MG capsule, Take 1 capsule (20 mg total) by mouth daily. (Patient not taking: Reported on 05/31/2017), Disp: 30 capsule, Rfl: 3  Review of Systems  Constitutional: Negative.   Gastrointestinal: Negative.   Genitourinary: Positive for vaginal discharge. Negative for decreased urine volume, difficulty urinating, dyspareunia, dysuria, enuresis, flank pain, frequency, genital sores, hematuria, menstrual problem, missed menses, pelvic pain, urgency, vaginal bleeding and vaginal pain.  Neurological: Negative for dizziness, light-headedness and headaches.    Social History   Tobacco Use  .  Smoking status: Former Games developermoker  . Smokeless tobacco: Never Used  . Tobacco comment: quit in 2000  Substance Use Topics  . Alcohol use: Yes    Alcohol/week: 0.0 oz    Comment: occasional   Objective:   BP 104/78 (BP Location: Right Arm, Patient Position: Sitting, Cuff Size: Normal)   Pulse 64   Temp 98.5 F (36.9 C) (Oral)   Resp 16   Wt 142 lb (64.4 kg)   LMP  (LMP Unknown)   BMI 25.56 kg/m  Vitals:   05/31/17 1001  BP: 104/78  Pulse: 64  Resp: 16  Temp: 98.5 F (36.9 C)  TempSrc: Oral  Weight: 142 lb (64.4 kg)     Physical Exam  Constitutional: She is oriented to person, place, and time. She appears well-developed and well-nourished.  Genitourinary: There is no rash, tenderness, lesion or injury on the right labia. There is no rash, tenderness, lesion or injury on the left labia. No erythema, tenderness or bleeding in the vagina. No foreign body in the vagina. No signs of injury around the vagina. Vaginal discharge found.  Neurological: She is alert and oriented to person, place, and time.  Skin: Skin is warm and dry.  Psychiatric: She has a normal mood and affect. Her behavior is normal.        Assessment & Plan:     1. Vaginal discharge  - fluconazole (DIFLUCAN) 150 MG tablet; Take one pill today and another pill 3 days later if it persists.  Dispense: 2 tablet; Refill: 0 - NuSwab Vaginitis (VG)  2. Vertigo  Acute on chronic worsening of vertigo.   - meclizine (ANTIVERT) 12.5 MG tablet; Take 1 tablet (12.5 mg total) by mouth 3 (three) times daily as needed for dizziness.  Dispense: 30 tablet; Refill: 0  Return if symptoms worsen or fail to improve.  The entirety of the information documented in the History of Present Illness, Review of Systems and Physical Exam were personally obtained by me. Portions of this information were initially documented by Kavin Leech, CMA and reviewed by me for thoroughness and accuracy.     Trey Sailors, PA-C  [facility]

## 2017-05-31 NOTE — Patient Instructions (Signed)

## 2017-06-03 ENCOUNTER — Telehealth: Payer: Self-pay

## 2017-06-03 DIAGNOSIS — B9689 Other specified bacterial agents as the cause of diseases classified elsewhere: Secondary | ICD-10-CM

## 2017-06-03 DIAGNOSIS — N760 Acute vaginitis: Principal | ICD-10-CM

## 2017-06-03 LAB — NUSWAB VAGINITIS (VG)
Atopobium vaginae: HIGH Score — AB
Candida albicans, NAA: NEGATIVE
Candida glabrata, NAA: NEGATIVE
Trich vag by NAA: NEGATIVE

## 2017-06-03 MED ORDER — METRONIDAZOLE 500 MG PO TABS
500.0000 mg | ORAL_TABLET | Freq: Two times a day (BID) | ORAL | 0 refills | Status: AC
Start: 2017-06-03 — End: 2017-06-10

## 2017-06-03 NOTE — Telephone Encounter (Signed)
Pt advised.  [facility] [address]   Thanks,   -Vernona RiegerLaura

## 2017-06-03 NOTE — Telephone Encounter (Signed)
Flagyl sent

## 2017-06-03 NOTE — Telephone Encounter (Signed)
-----   Message from Trey SailorsAdriana M Pollak, [place]PA-C sent at 06/02/2017  4:39 PM EDT ----- Claudia RenNuswab showed no yeast, did show BV. Can send in oral flagyl for patient if she would like. Discontinue Diflucan.

## 2017-06-24 ENCOUNTER — Telehealth: Payer: Self-pay | Admitting: Family Medicine

## 2017-06-24 DIAGNOSIS — N898 Other specified noninflammatory disorders of vagina: Secondary | ICD-10-CM

## 2017-06-24 MED ORDER — METRONIDAZOLE 500 MG PO TABS: 500.0000 mg | ORAL_TABLET | Freq: Two times a day (BID) | ORAL | 0 refills | Status: AC

## 2017-06-24 NOTE — Telephone Encounter (Signed)
I will send in another course of flagyl but after that if she is still symptomatic she will need another office visit.

## 2017-06-24 NOTE — Telephone Encounter (Signed)
Pt called back saying she is having the same symptoms as before discharge and itching when she came in and seen Adriana.  She has taken Flagyl that was prescribed.  She did say she waited 12 days to take the prescription.  [place] and didn't want to take it on vacation.  Please advise  [phone]  Pt has no insurance and does not want to have to come back in  [facility]

## 2017-06-24 NOTE — Telephone Encounter (Signed)
Advised patient as below.  

## 2017-06-24 NOTE — Telephone Encounter (Signed)
Please review. Thanks!  

## 2017-06-27 ENCOUNTER — Ambulatory Visit: Payer: Self-pay | Admitting: Physician Assistant

## 2017-06-27 ENCOUNTER — Telehealth: Payer: Self-pay | Admitting: Family Medicine

## 2017-06-27 NOTE — Telephone Encounter (Signed)
Pt advised.... She made an appointment for 4 this afternoon.   Thanks,   -Vernona Rieger

## 2017-06-27 NOTE — Telephone Encounter (Signed)
Pt called back.  She will wait for Adrianas nurse to call back.

## 2017-06-27 NOTE — Telephone Encounter (Signed)
Called and spoke with patient on the phone she states that she was seen by Adrianna back in April for bacteria vaginosis. Patient states that she wasn't sure what bacteria infection this was and I explained to patient that it can be caused by a overgrowth of bad bacteria, patient states that she has concerns because she is having similar symptoms from that day she was seen in office but isn't sure of her symptoms are BV or symptoms of a urinary tract infection. I told patient that Nadine Counts would not prescribe medication without being seen ion office. Patient would like Adriannas nurse to give her a call back. KW

## 2017-06-27 NOTE — Telephone Encounter (Signed)
Came in in April and has been on antibiotic since 2 rounds.  She is now having UTI type symptoms.  She wants to know exactly what her lab work showed.  She would like a nurse to her back at [phone]  She does not have any insurance right now.    Thanks [employer]

## 2017-10-31 ENCOUNTER — Other Ambulatory Visit: Payer: Self-pay | Admitting: Family Medicine

## 2017-10-31 ENCOUNTER — Telehealth: Payer: Self-pay | Admitting: Family Medicine

## 2017-10-31 DIAGNOSIS — F419 Anxiety disorder, unspecified: Secondary | ICD-10-CM

## 2017-10-31 MED ORDER — CLONAZEPAM 1 MG PO TABS: 1.0000 mg | ORAL_TABLET | Freq: Two times a day (BID) | ORAL | 1 refills | Status: DC

## 2017-10-31 NOTE — Telephone Encounter (Signed)
Last filled 03/08/17, last office visit 06/13/15. Please review. KW

## 2017-10-31 NOTE — Telephone Encounter (Signed)
Pt needs refill on   Clonazepam 1 mg  [facility] [address]  Pt[facility] [address]  [phone]  Thanks Barth Kirks

## 2017-10-31 NOTE — Telephone Encounter (Signed)
done

## 2018-02-21 ENCOUNTER — Encounter: Payer: Self-pay | Admitting: Family Medicine

## 2018-02-21 ENCOUNTER — Ambulatory Visit: Payer: Self-pay | Admitting: Family Medicine

## 2018-02-21 ENCOUNTER — Other Ambulatory Visit: Payer: Self-pay

## 2018-02-21 VITALS — BP 104/76 | HR 86 | Temp 98.3°F | Ht 62.0 in | Wt 149.0 lb

## 2018-02-21 DIAGNOSIS — J0120 Acute ethmoidal sinusitis, unspecified: Secondary | ICD-10-CM

## 2018-02-21 MED ORDER — DOXYCYCLINE HYCLATE 100 MG PO TABS: 100.0000 mg | ORAL_TABLET | Freq: Two times a day (BID) | ORAL | 0 refills | Status: DC

## 2018-02-21 MED ORDER — HYDROCODONE-HOMATROPINE 5-1.5 MG/5ML PO SYRP: 5.0000 mL | ORAL_SOLUTION | Freq: Four times a day (QID) | ORAL | 0 refills | Status: AC | PRN

## 2018-02-21 NOTE — Progress Notes (Signed)
  Subjective:     Patient ID: Claudia Moses, female   DOB: 1970/12/15, 47 y.o.   MRN: [id number] Chief Complaint  Patient presents with  . Cough    head congestion, started as sore throat, some mucus with yellowish green mucus thick x 4 weeks 01/23/18.  using mucinex and nyquil to help sleep   HPI Patient reports increased sinus pressure, purulent sinus drainage, post nasal drainage and accompanying cough.  Review of Systems     Objective:   Physical Exam Constitutional:      General: She is not in acute distress.    Appearance: Normal appearance. She is ill-appearing (frequent hacky cough).   Ears: T.M's intact without inflammation Sinuses: non-tender Throat: no tonsillar enlargement or exudate Neck: no cervical adenopathy Lungs: clear     Assessment:    1. Acute non-recurrent ethmoidal sinusitis - doxycycline (VIBRA-TABS) 100 MG tablet; Take 1 tablet (100 mg total) by mouth 2 (two) times daily.  Dispense: 20 tablet; Refill: 0 - HYDROcodone-homatropine (HYCODAN) 5-1.5 MG/5ML syrup; Take 5 mLs by mouth every 6 (six) hours as needed for up to 5 days. 5 ml 4-6 hours as needed for cough  Dispense: 100 mL; Refill: 0    Plan:    Continue Mucinex D.

## 2018-02-21 NOTE — Patient Instructions (Signed)
Continue Mucinex D for congestion. 

## 2018-06-22 ENCOUNTER — Ambulatory Visit (INDEPENDENT_AMBULATORY_CARE_PROVIDER_SITE_OTHER): Payer: Self-pay | Admitting: Physician Assistant

## 2018-06-22 DIAGNOSIS — R05 Cough: Secondary | ICD-10-CM

## 2018-06-22 DIAGNOSIS — G4700 Insomnia, unspecified: Secondary | ICD-10-CM

## 2018-06-22 DIAGNOSIS — R059 Cough, unspecified: Secondary | ICD-10-CM

## 2018-06-22 DIAGNOSIS — J301 Allergic rhinitis due to pollen: Secondary | ICD-10-CM

## 2018-06-22 DIAGNOSIS — F419 Anxiety disorder, unspecified: Secondary | ICD-10-CM

## 2018-06-22 DIAGNOSIS — R0981 Nasal congestion: Secondary | ICD-10-CM

## 2018-06-22 MED ORDER — HYDROCODONE-HOMATROPINE 5-1.5 MG/5ML PO SYRP: 5.0000 mL | ORAL_SOLUTION | Freq: Three times a day (TID) | ORAL | 0 refills | Status: DC | PRN

## 2018-06-22 MED ORDER — HYDROXYZINE HCL 25 MG PO TABS: 25.0000 mg | ORAL_TABLET | Freq: Every day | ORAL | 0 refills | Status: DC | PRN

## 2018-06-22 MED ORDER — PAROXETINE HCL 10 MG PO TABS: 10.0000 mg | ORAL_TABLET | Freq: Every day | ORAL | 0 refills | Status: DC

## 2018-06-22 NOTE — Patient Instructions (Addendum)
Call us back in one week if you need an antibiotic for sinus congestion.  We sent in cough medication for you.   We are also sending in hydroxyzine to help come off of the klonopin. Try to wait until you feel you really need the klonopin before taking it.    Allergic Rhinitis, Adult Allergic rhinitis is a reaction to allergens in the air. Allergens are tiny specks (particles) in the air that cause your body to have an allergic reaction. This condition cannot be passed from person to person (is not contagious). Allergic rhinitis cannot be cured, but it can be controlled. There are two types of allergic rhinitis:  Seasonal. This type is also called hay fever. It happens only during certain times of the year.  Perennial. This type can happen at any time of the year. What are the causes? This condition may be caused by:  Pollen from grasses, trees, and weeds.  House dust mites.  Pet dander.  Mold. What are the signs or symptoms? Symptoms of this condition include:  Sneezing.  Runny or stuffy nose (nasal congestion).  A lot of mucus in the back of the throat (postnasal drip).  Itchy nose.  Tearing of the eyes.  Trouble sleeping.  Being sleepy during day. How is this treated? There is no cure for this condition. You should avoid things that trigger your symptoms (allergens). Treatment can help to relieve symptoms. This may include:  Medicines that block allergy symptoms, such as antihistamines. These may be given as a shot, nasal spray, or pill.  Shots that are given until your body becomes less sensitive to the allergen (desensitization).  Stronger medicines, if all other treatments have not worked. Follow these instructions at home: Avoiding allergens   Find out what you are allergic to. Common allergens include smoke, dust, and pollen.  Avoid them if you can. These are some of the things that you can do to avoid allergens: ? Replace carpet with wood, tile, or vinyl  flooring. Carpet can trap dander and dust. ? Clean any mold found in the home. ? Do not smoke. Do not allow smoking in your home. ? Change your heating and air conditioning filter at least once a month. ? During allergy season:  Keep windows closed as much as you can. If possible, use air conditioning when there is a lot of pollen in the air.  Use a special filter for allergies with your furnace and air conditioner.  Plan outdoor activities when pollen counts are lowest. This is usually during the early morning or evening hours.  If you do go outdoors when pollen count is high, wear a special mask for people with allergies.  When you come indoors, take a shower and change your clothes before sitting on furniture or bedding. General instructions  Do not use fans in your home.  Do not hang clothes outside to dry.  Wear sunglasses to keep pollen out of your eyes.  Wash your hands right away after you touch household pets.  Take over-the-counter and prescription medicines only as told by your doctor.  Keep all follow-up visits as told by your doctor. This is important. Contact a doctor if:  You have a fever.  You have a cough that does not go away (is persistent).  You start to make whistling sounds when you breathe (wheeze).  Your symptoms do not get better with treatment.  You have thick fluid coming from your nose.  You start to have nosebleeds. Get  help right away if:  Your tongue or your lips are swollen.  You have trouble breathing.  You feel dizzy or you feel like you are going to pass out (faint).  You have cold sweats. Summary  Allergic rhinitis is a reaction to allergens in the air.  This condition may be caused by allergens. These include pollen, dust mites, pet dander, and mold.  Symptoms include a runny, itchy nose, sneezing, or tearing eyes. You may also have trouble sleeping or feel sleepy during the day.  Treatment includes taking medicines and  avoiding allergens. You may also get shots or take stronger medicines.  Get help if you have a fever or a cough that does not stop. Get help right away if you are short of breath. This information is not intended to replace advice given to you by your health care provider. Make sure you discuss any questions you have with your health care provider. Document Released: 06/10/2010 Document Revised: 08/30/2017 Document Reviewed: 08/30/2017 Elsevier Interactive Patient Education  2019 [employer].

## 2018-06-22 NOTE — Progress Notes (Signed)
Patient: Claudia Moses Female    DOB: 1970/03/07   48 y.o.   MRN: [id number] Visit Date: 06/22/2018  Today's Provider: Trey Sailors, PA-C   Chief Complaint  Patient presents with  . Sinusitis   Subjective:    Virtual Visit via Video Note  I connected with Claudia Moses on 06/22/18 at  2:00 PM EDT by a video enabled telemedicine application and verified that I am speaking with the correct person using two identifiers.   I discussed the limitations of evaluation and management by telemedicine and the availability of in person appointments. The patient expressed understanding and agreed to proceed.   Patient location: home Provider location: [facility]/home office  Persons involved in the visit: patient, provider   HPI  Patient states that she has a possible sinus infection that started about 1.5 weeks ago. The congestion has moved to her chest and she is currently taking Mucinex. She is having symptoms of sneezing, cough, drainage down back of throat, sinus pressure. Reports bad sinus headache one week ago. Reports she usually gets a cough medication and this will get her through. Not taking anything like allegra, zyrtec, claritin, and xyzal. Reports this happens once per year.  Anxiety  Patient is requesting refills of klonopin. Reports she takes klonopin 1 mg every other day for panic attacks. Reports she has been taking this for 20 years. This is the only anxiety medicine she takes and has ever taken.   Relates a history of anxiety that started many years ago during times of great distress. This includes father being in prison, going through divorce, among other things. She reports she has symptoms of panic including feeling like she cannot take a deep breath. She reports previously she was prescribed an anti-depressant and filled it but never took it because she read the side effects and was nervous about them. She reports she will take klonopin as needed for  panic but sometimes she will take it in anticipation of panic.   Insomnia: Reports a history of insomnia that started around time of anxiety. Reports it is difficult to turn her mind off at night and she is very restless. She has been so far using tylenol PM and does not feel like it is working anymore. Drinks half caf or decaffeinated coffee in the morning. Does not use screens or exercise immediately before bed.   Allergies  Allergen Reactions  . Amoxicillin-Pot Clavulanate   . Promethazine      Current Outpatient Medications:  .  clonazePAM (KLONOPIN) 1 MG tablet, Take 1 tablet (1 mg total) by mouth 2 (two) times daily., Disp: 60 tablet, Rfl: 1 .  meclizine (ANTIVERT) 12.5 MG tablet, Take 1 tablet (12.5 mg total) by mouth 3 (three) times daily as needed for dizziness., Disp: 30 tablet, Rfl: 0 .  doxycycline (VIBRA-TABS) 100 MG tablet, Take 1 tablet (100 mg total) by mouth 2 (two) times daily. (Patient not taking: Reported on 06/22/2018), Disp: 20 tablet, Rfl: 0 .  omeprazole (PRILOSEC) 20 MG capsule, Take 1 capsule (20 mg total) by mouth daily. (Patient not taking: Reported on 02/21/2018), Disp: 30 capsule, Rfl: 3  Review of Systems  Constitutional: Negative.   HENT: Positive for congestion, sinus pressure, sinus pain and sneezing.   Eyes: Negative.   Respiratory: Positive for cough.   Cardiovascular: Negative.   Gastrointestinal: Negative.   Endocrine: Negative.   Genitourinary: Negative.   Musculoskeletal: Negative.   Skin: Negative.  Allergic/Immunologic: Negative.   Neurological: Positive for headaches.  Hematological: Negative.   Psychiatric/Behavioral: Negative.     Social History   Tobacco Use  . Smoking status: Former Games developermoker  . Smokeless tobacco: Never Used  . Tobacco comment: quit in 2000  Substance Use Topics  . Alcohol use: Yes    Alcohol/week: 0.0 standard drinks    Comment: occasional      Objective:   LMP  (LMP Unknown)  There were no vitals filed for  this visit.   Physical Exam Constitutional:      Appearance: Normal appearance.  Neurological:     Mental Status: She is alert.  Psychiatric:        Mood and Affect: Mood normal.        Behavior: Behavior normal.         Assessment & Plan    1. Allergic rhinitis due to pollen, unspecified seasonality  Take daily 2nd gen antihistamine. May call back in 1 week if worsening for antibiotic.  2. Sinus congestion   3. Insomnia, unspecified type  I think her insomnia is part of a large picture of undertreated anxiety. Patient currently takes klonopin every other day for panic attacks but sometimes takes it in anticipation for anxiety. Explained that if she is having to use klonopin this frequently, she should be on a daily anxiety medication and this would also likely help her sleep. Will start Paxil as below taken at bedtime. She should work on reducing her klonopin use, first by starting to take 1 mg only when she has anxiety symptoms and by using 0.5 mg for anxiety symptoms. She may begin to supplement with hydroxyzine PRN for acute anxiety. She should stop using Tylenol PM. Will reach out to schedule in 3 weeks for follow up in about 1-2 months.   4. Cough  - HYDROcodone-homatropine (HYCODAN) 5-1.5 MG/5ML syrup; Take 5 mLs by mouth every 8 (eight) hours as needed for cough.  Dispense: 120 mL; Refill: 0  5. Acute anxiety  - hydrOXYzine (ATARAX/VISTARIL) 25 MG tablet; Take 1 tablet (25 mg total) by mouth daily as needed.  Dispense: 30 tablet; Refill: 0 - PARoxetine (PAXIL) 10 MG tablet; Take 1 tablet (10 mg total) by mouth daily.  Dispense: 90 tablet; Refill: 0  The entirety of the information documented in the History of Present Illness, Review of Systems and Physical Exam were personally obtained by me. Portions of this information were initially documented by Lexine BatonStacy Baldwin, LPN and reviewed by me for thoroughness and accuracy.          Trey SailorsAdriana M Pollak, PA-C  [facility]

## 2018-07-14 ENCOUNTER — Other Ambulatory Visit: Payer: Self-pay | Admitting: Physician Assistant

## 2018-07-14 DIAGNOSIS — F419 Anxiety disorder, unspecified: Secondary | ICD-10-CM

## 2018-08-28 ENCOUNTER — Other Ambulatory Visit: Payer: Self-pay | Admitting: Physician Assistant

## 2018-08-28 DIAGNOSIS — F419 Anxiety disorder, unspecified: Secondary | ICD-10-CM

## 2018-08-28 NOTE — Telephone Encounter (Signed)
Pt has been taking an anxiety medication for Paroxetine is giving her some strange side effects.  She would like Adriana to call her  CB#  [phone]  Thanks Con Memos

## 2018-08-28 NOTE — Telephone Encounter (Signed)
Patient reports she started paroxetine about 4-5 days ago. Patient reports dizziness, insomnia, hot flashes with new medication. Patient requesting to change back to klonopin.

## 2018-08-28 NOTE — Telephone Encounter (Signed)
lmtcb

## 2018-08-28 NOTE — Telephone Encounter (Signed)
Patient advised as below. Patient verbalizes understanding and is in agreement with treatment plan.  

## 2018-08-28 NOTE — Telephone Encounter (Signed)
Paxil can cause dizziness in the first two weeks, that is a side effect. This side effect should go away the longer you take it. If side effects do not go away in a couple of weeks we can consider changing it, we should schedule a follow up though. Hydroxyzine should be used for her as needed anxiety. I will refill her klonopin at a lower dose but we discussed about reducing this and I don't prescribe it long term.

## 2018-09-12 ENCOUNTER — Other Ambulatory Visit: Payer: Self-pay | Admitting: Physician Assistant

## 2018-09-12 DIAGNOSIS — F419 Anxiety disorder, unspecified: Secondary | ICD-10-CM

## 2018-09-12 NOTE — Telephone Encounter (Signed)
L.O.V. was 06/22/2018,please advise.

## 2018-09-18 MED ORDER — HYDROXYZINE HCL 25 MG PO TABS: 25.0000 mg | ORAL_TABLET | Freq: Every day | ORAL | 0 refills | Status: DC | PRN

## 2018-10-03 ENCOUNTER — Other Ambulatory Visit: Payer: Self-pay | Admitting: Physician Assistant

## 2018-10-03 DIAGNOSIS — F419 Anxiety disorder, unspecified: Secondary | ICD-10-CM

## 2018-10-03 MED ORDER — HYDROXYZINE HCL 25 MG PO TABS: 25.0000 mg | ORAL_TABLET | Freq: Every day | ORAL | 0 refills | Status: DC | PRN

## 2018-10-03 NOTE — Telephone Encounter (Signed)
Pt needing refill on:  hydrOXYzine (ATARAX/VISTARIL) 25 MG tablet  Please fill at:  [id number] [address], [place] [phone] (Phone) [phone] (Fax)   Thanks, [employer]

## 2018-10-18 ENCOUNTER — Other Ambulatory Visit: Payer: Self-pay | Admitting: Physician Assistant

## 2018-10-18 ENCOUNTER — Other Ambulatory Visit: Payer: Self-pay

## 2018-10-18 DIAGNOSIS — F419 Anxiety disorder, unspecified: Secondary | ICD-10-CM

## 2018-10-18 MED ORDER — HYDROXYZINE HCL 25 MG PO TABS: 25.0000 mg | ORAL_TABLET | Freq: Every day | ORAL | 0 refills | Status: DC | PRN

## 2018-10-18 NOTE — Telephone Encounter (Signed)
Patient requesting refill on the following medication hydrOXYzine (ATARAX/VISTARIL) 25 MG tablet  Pharmacy: [facility]

## 2018-11-29 ENCOUNTER — Other Ambulatory Visit: Payer: Self-pay | Admitting: Physician Assistant

## 2018-11-29 DIAGNOSIS — F419 Anxiety disorder, unspecified: Secondary | ICD-10-CM

## 2018-12-05 ENCOUNTER — Other Ambulatory Visit: Payer: Self-pay

## 2018-12-05 ENCOUNTER — Ambulatory Visit (INDEPENDENT_AMBULATORY_CARE_PROVIDER_SITE_OTHER): Payer: Self-pay | Admitting: Family Medicine

## 2018-12-05 ENCOUNTER — Encounter: Payer: Self-pay | Admitting: Family Medicine

## 2018-12-05 DIAGNOSIS — F411 Generalized anxiety disorder: Secondary | ICD-10-CM

## 2018-12-05 MED ORDER — SERTRALINE HCL 50 MG PO TABS: 50.0000 mg | ORAL_TABLET | Freq: Every day | ORAL | 3 refills | Status: DC

## 2018-12-05 MED ORDER — CLONAZEPAM 1 MG PO TABS: 1.0000 mg | ORAL_TABLET | Freq: Two times a day (BID) | ORAL | 1 refills | Status: DC | PRN

## 2018-12-05 MED ORDER — TRAZODONE HCL 50 MG PO TABS: 25.0000 mg | ORAL_TABLET | Freq: Every evening | ORAL | 3 refills | Status: DC | PRN

## 2018-12-05 NOTE — Progress Notes (Signed)
Patient: Claudia Moses Female    DOB: 25-Oct-1970   48 y.o.   MRN: [id number] Visit Date: 12/06/2018  Today's Provider: Lavon Paganini, MD   Chief Complaint  Patient presents with  . Anxiety  . Depression   Subjective:    I, Porsha McClurkin CMA, am acting as a Education administrator for Lavon Paganini, MD.   HPI  Anxiety & Depression Patient presents today for anxiety and depression follow-up.Patient last office was on 06/22/2018. She is currently taking Hydroxyzine 25 MG BID and Paroxetine 10 MG qhs.  Patient reports that she can not sleep or sit still while taking the medication. She was previously on clonazepam for several years and was stable on it.  Having nausea and RLS.  Feeling more anxious now than when she started it.  Feeling jittery and lack of concentration. Tried melatonin and CBD gummy x3 at bedtime. She has also gained 10-15 lbs on paxil.    GAD 7 : Generalized Anxiety Score 12/05/2018  Nervous, Anxious, on Edge 3  Control/stop worrying 3  Worry too much - different things 2  Trouble relaxing 2  Restless 3  Easily annoyed or irritable 1  Afraid - awful might happen 0  Total GAD 7 Score 14  Anxiety Difficulty Somewhat difficult   Depression screen [facility] 2/9 12/05/2018 02/21/2018 10/11/2016  Decreased Interest 1 0 0  Down, Depressed, Hopeless 1 0 0  PHQ - 2 Score 2 0 0  Altered sleeping 3 - -  Tired, decreased energy 2 - -  Change in appetite 3 - -  Feeling bad or failure about yourself  2 - -  Trouble concentrating 3 - -  Moving slowly or fidgety/restless 3 - -  Suicidal thoughts 0 - -  PHQ-9 Score 18 - -  Difficult doing work/chores Somewhat difficult - -    Allergies  Allergen Reactions  . Amoxicillin-Pot Clavulanate   . Promethazine      Current Outpatient Medications:  .  meclizine (ANTIVERT) 12.5 MG tablet, Take 1 tablet (12.5 mg total) by mouth 3 (three) times daily as needed for dizziness., Disp: 30 tablet, Rfl: 0 .  clonazePAM (KLONOPIN) 1 MG  tablet, Take 1 tablet (1 mg total) by mouth 2 (two) times daily as needed for anxiety., Disp: 60 tablet, Rfl: 1 .  sertraline (ZOLOFT) 50 MG tablet, Take 1 tablet (50 mg total) by mouth daily., Disp: 30 tablet, Rfl: 3 .  traZODone (DESYREL) 50 MG tablet, Take 0.5-1 tablets (25-50 mg total) by mouth at bedtime as needed for sleep., Disp: 30 tablet, Rfl: 3  Review of Systems  Constitutional: Negative.   Respiratory: Negative.   Genitourinary: Negative.   Psychiatric/Behavioral: Negative.     Social History   Tobacco Use  . Smoking status: Former Research scientist (life sciences)  . Smokeless tobacco: Never Used  . Tobacco comment: quit in 2000  Substance Use Topics  . Alcohol use: Yes    Alcohol/week: 0.0 standard drinks    Comment: occasional      Objective:   BP 110/77 (BP Location: Left Arm, Patient Position: Sitting, Cuff Size: Normal)   Pulse 68   Temp (!) 96.8 F (36 C) (Temporal)   Wt 159 lb 6.4 oz (72.3 kg)   LMP  (LMP Unknown)   SpO2 97%   BMI 29.15 kg/m  Vitals:   12/05/18 0826  BP: 110/77  Pulse: 68  Temp: (!) 96.8 F (36 C)  TempSrc: Temporal  SpO2: 97%  Weight: 159 lb 6.4  oz (72.3 kg)  Body mass index is 29.15 kg/m.   Physical Exam Vitals signs reviewed.  Constitutional:      General: She is not in acute distress.    Appearance: Normal appearance. She is well-developed.  HENT:     Head: Normocephalic and atraumatic.  Eyes:     General: No scleral icterus.    Conjunctiva/sclera: Conjunctivae normal.  Neck:     Musculoskeletal: Neck supple.  Cardiovascular:     Rate and Rhythm: Normal rate and regular rhythm.     Pulses: Normal pulses.     Heart sounds: Normal heart sounds. No murmur.  Pulmonary:     Effort: Pulmonary effort is normal. No respiratory distress.     Breath sounds: Normal breath sounds. No wheezing.  Abdominal:     General: There is no distension.     Palpations: Abdomen is soft.     Tenderness: There is no abdominal tenderness.  Musculoskeletal:      Right lower leg: No edema.     Left lower leg: No edema.  Lymphadenopathy:     Cervical: No cervical adenopathy.  Skin:    General: Skin is warm and dry.     Capillary Refill: Capillary refill takes less than 2 seconds.     Findings: No rash.  Neurological:     Mental Status: She is alert and oriented to person, place, and time. Mental status is at baseline.  Psychiatric:        Attention and Perception: Attention normal.        Mood and Affect: Affect normal. Mood is anxious.        Speech: Speech normal.        Behavior: Behavior normal. Behavior is cooperative.        Thought Content: Thought content does not include homicidal or suicidal ideation.      No results found for any visits on 12/05/18.     Assessment & Plan   Problem List Items Addressed This Visit      Other   GAD (generalized anxiety disorder)    Chronic and uncontrolled Patient does score highly on her PHQ 9 as well, but she reports that these are just symptoms related to her anxiety, such as change in appetite and sleep She was previously well controlled on Klonopin for her anxiety We had a long discussion regarding the risks of long-term benzos, including addictive potential, increasing tolerance, falls, confusion I believe that the hydroxyzine and Paxil gave her anticholinergic effects that contributed to her restless leg symptoms and other side effects We will discontinue both of these medications We did discuss the importance of SSRI therapy Will Start Zoloft 50 mg daily Discussed potential side effects, incl GI upset, sexual dysfunction, increased anxiety, and SI Discussed that it can take 6-8 weeks to reach full efficacy Contracted for safety - no SI/HI Discussed synergistic effects of medications and therapy  Can use Klonopin sparingly for panic symptoms while Zoloft is titrating We discussed that the hope in the future would be to discontinue Klonopin and continue the Zoloft to have better baseline  control Can also use trazodone as needed for sleep, but once Zoloft is at full efficacy, she may not need this anymore Would not take trazodone and Klonopin together at bedtime Follow-up in 6 weeks Repeat PHQ 9 and GAD 7 at next visit      Relevant Medications   sertraline (ZOLOFT) 50 MG tablet   traZODone (DESYREL) 50 MG tablet  clonazePAM (KLONOPIN) 1 MG tablet       Return in about 6 weeks (around 01/16/2019) for MDD/GAD f/u.   The entirety of the information documented in the History of Present Illness, Review of Systems and Physical Exam were personally obtained by me. Portions of this information were initially documented by [facility], CMA and reviewed by me for thoroughness and accuracy.    Nalea Salce, Marzella Schlein, MD MPH [facility]

## 2018-12-05 NOTE — Patient Instructions (Signed)

## 2018-12-06 DIAGNOSIS — F411 Generalized anxiety disorder: Secondary | ICD-10-CM | POA: Insufficient documentation

## 2018-12-06 NOTE — Assessment & Plan Note (Addendum)
Chronic and uncontrolled Patient does score highly on her PHQ 9 as well, but she reports that these are just symptoms related to her anxiety, such as change in appetite and sleep She was previously well controlled on Klonopin for her anxiety We had a long discussion regarding the risks of long-term benzos, including addictive potential, increasing tolerance, falls, confusion I believe that the hydroxyzine and Paxil gave her anticholinergic effects that contributed to her restless leg symptoms and other side effects We will discontinue both of these medications We did discuss the importance of SSRI therapy Will Start Zoloft 50 mg daily Discussed potential side effects, incl GI upset, sexual dysfunction, increased anxiety, and SI Discussed that it can take 6-8 weeks to reach full efficacy Contracted for safety - no SI/HI Discussed synergistic effects of medications and therapy  Can use Klonopin sparingly for panic symptoms while Zoloft is titrating We discussed that the hope in the future would be to discontinue Klonopin and continue the Zoloft to have better baseline control Can also use trazodone as needed for sleep, but once Zoloft is at full efficacy, she may not need this anymore Would not take trazodone and Klonopin together at bedtime Follow-up in 6 weeks Repeat PHQ 9 and GAD 7 at next visit

## 2018-12-28 ENCOUNTER — Emergency Department
Admission: EM | Admit: 2018-12-28 | Discharge: 2018-12-28 | Disposition: A | Discharge status: Home / Self Care | Payer: No Typology Code available for payment source | Attending: Emergency Medicine | Admitting: Emergency Medicine

## 2018-12-28 ENCOUNTER — Encounter: Payer: Self-pay | Admitting: Emergency Medicine

## 2018-12-28 ENCOUNTER — Other Ambulatory Visit: Payer: Self-pay

## 2018-12-28 ENCOUNTER — Emergency Department: Payer: No Typology Code available for payment source

## 2018-12-28 DIAGNOSIS — W2210XA Striking against or struck by unspecified automobile airbag, initial encounter: Secondary | ICD-10-CM | POA: Insufficient documentation

## 2018-12-28 DIAGNOSIS — Y998 Other external cause status: Secondary | ICD-10-CM | POA: Insufficient documentation

## 2018-12-28 DIAGNOSIS — S40012A Contusion of left shoulder, initial encounter: Secondary | ICD-10-CM | POA: Diagnosis not present

## 2018-12-28 DIAGNOSIS — Z87891 Personal history of nicotine dependence: Secondary | ICD-10-CM | POA: Diagnosis not present

## 2018-12-28 DIAGNOSIS — S7002XA Contusion of left hip, initial encounter: Secondary | ICD-10-CM | POA: Insufficient documentation

## 2018-12-28 DIAGNOSIS — Y92410 Unspecified street and highway as the place of occurrence of the external cause: Secondary | ICD-10-CM | POA: Diagnosis not present

## 2018-12-28 DIAGNOSIS — Y9389 Activity, other specified: Secondary | ICD-10-CM | POA: Insufficient documentation

## 2018-12-28 DIAGNOSIS — S161XXA Strain of muscle, fascia and tendon at neck level, initial encounter: Secondary | ICD-10-CM | POA: Diagnosis not present

## 2018-12-28 DIAGNOSIS — S199XXA Unspecified injury of neck, initial encounter: Secondary | ICD-10-CM | POA: Diagnosis present

## 2018-12-28 DIAGNOSIS — Z79899 Other long term (current) drug therapy: Secondary | ICD-10-CM | POA: Diagnosis not present

## 2018-12-28 MED ORDER — MELOXICAM 15 MG PO TABS: 15.0000 mg | ORAL_TABLET | Freq: Every day | ORAL | 2 refills | Status: DC

## 2018-12-28 MED ORDER — METHOCARBAMOL 500 MG PO TABS: 500.0000 mg | ORAL_TABLET | Freq: Four times a day (QID) | ORAL | 0 refills | Status: DC
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
## 2018-12-28 NOTE — Discharge Instructions (Addendum)
Follow-up with either your regular doctor, Dr. Posey Pronto, or chiropractor if continued pain in 1 week.  Apply ice to all areas that hurt for the next 3 days.  Then you may apply wet heat followed by ice.  Take your medications as prescribed.  Return if worsening.
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
## 2018-12-29 ENCOUNTER — Other Ambulatory Visit: Payer: Self-pay | Admitting: Family Medicine

## 2019-01-29 NOTE — Progress Notes (Signed)
Patient: Claudia Moses Female    DOB: 1970/03/07   48 y.o.   MRN: [id number] Visit Date: 01/29/2019  Today's Provider: Shirlee Latch, MD   Chief Complaint  Patient presents with  . Anxiety   Subjective:    Virtual Visit via Telephone Note  I connected with [name] on 01/29/19 at  8:40 AM EST by telephone and verified that I am speaking with the correct person using two identifiers.  Location: Patient location: home Provider location: [facility] Persons involved in the visit: patient, provider   I discussed the limitations, risks, security and privacy concerns of performing an evaluation and management service by telephone and the availability of in person appointments. I also discussed with the patient that there may be a patient responsible charge related to this service. The patient expressed understanding and agreed to proceed.   Anxiety Presents for follow-up visit. Symptoms include nervous/anxious behavior. Patient reports no confusion, decreased concentration or suicidal ideas.     Pt reports she is only taking clonazepam as needed for anxiety.  She never started taking sertraline, and stopped trazodone.  She feels like day to day she has no anxiety.  She has stopped the Paxil.  She is only taking Clonazepam 1/2 tab 1-2 times weekly.  Of the 60 that she filled on 12/05/2018.   Allergies  Allergen Reactions  . Amoxicillin-Pot Clavulanate   . Promethazine      Current Outpatient Medications:  .  clonazePAM (KLONOPIN) 1 MG tablet, Take 1 tablet (1 mg total) by mouth 2 (two) times daily as needed for anxiety., Disp: 60 tablet, Rfl: 1 .  meclizine (ANTIVERT) 12.5 MG tablet, Take 1 tablet (12.5 mg total) by mouth 3 (three) times daily as needed for dizziness., Disp: 30 tablet, Rfl: 0 .  meloxicam (MOBIC) 15 MG tablet, Take 1 tablet (15 mg total) by mouth daily., Disp: 30 tablet, Rfl: 2 .  methocarbamol (ROBAXIN) 500 MG tablet, Take 1 tablet (500  mg total) by mouth 4 (four) times daily. (Patient not taking: Reported on 01/29/2019), Disp: 28 tablet, Rfl: 0 .  sertraline (ZOLOFT) 50 MG tablet, TAKE 1 TABLET BY MOUTH EVERY DAY (Patient not taking: Reported on 01/29/2019), Disp: 90 tablet, Rfl: 2 .  traZODone (DESYREL) 50 MG tablet, TAKE 0.5-1 TABLETS (25-50 MG TOTAL) BY MOUTH AT BEDTIME AS NEEDED FOR SLEEP. (Patient not taking: Reported on 01/29/2019), Disp: 90 tablet, Rfl: 2  Review of Systems  Constitutional: Negative.   Respiratory: Negative.   Cardiovascular: Negative.   Neurological: Negative.   Psychiatric/Behavioral: Negative for behavioral problems, confusion, decreased concentration, dysphoric mood, sleep disturbance and suicidal ideas. The patient is nervous/anxious.     Social History   Tobacco Use  . Smoking status: Former Games developer  . Smokeless tobacco: Never Used  . Tobacco comment: quit in 2000  Substance Use Topics  . Alcohol use: Yes    Alcohol/week: 0.0 standard drinks    Comment: occasional      Objective:   LMP  (LMP Unknown)  There were no vitals filed for this visit.There is no height or weight on file to calculate BMI.   Physical Exam Speaking in full sentences in NAD  No results found for any visits on 01/30/19.     Assessment & Plan    Follow Up Instructions: I discussed the assessment and treatment plan with the patient. The patient was provided an opportunity to ask questions and all were answered. The patient agreed with  the plan and demonstrated an understanding of the instructions.   The patient was advised to call back or seek an in-person evaluation if the symptoms worsen or if the condition fails to improve as anticipated.  Problem List Items Addressed This Visit      Other   GAD (generalized anxiety disorder) - Primary    Chronic and well controlled She has stopped SSRIs and is now feeling better since stopping Paxil Can continue Klonopin sparingly for anxiety episodes Still  encouraged her to start Zoloft and this would be different than Paxil We again discussed risks of long-term benzos Will cut Rx to #30 and plan to not fill any more than once per month. (She still has medication, so will call when she is running low) F/u in 3-6 months      Relevant Medications   sertraline (ZOLOFT) 50 MG tablet       Return in about 6 months (around 07/31/2019) for GAD f/u.   The entirety of the information documented in the History of Present Illness, Review of Systems and Physical Exam were personally obtained by me. Portions of this information were initially documented by Ashley Royalty, CMA and reviewed by me for thoroughness and accuracy.    Bacigalupo, Dionne Bucy, MD MPH [facility]

## 2019-01-30 ENCOUNTER — Ambulatory Visit (INDEPENDENT_AMBULATORY_CARE_PROVIDER_SITE_OTHER): Payer: Self-pay | Admitting: Family Medicine

## 2019-01-30 ENCOUNTER — Encounter: Payer: Self-pay | Admitting: Family Medicine

## 2019-01-30 DIAGNOSIS — F411 Generalized anxiety disorder: Secondary | ICD-10-CM

## 2019-01-30 MED ORDER — SERTRALINE HCL 50 MG PO TABS: 50.0000 mg | ORAL_TABLET | Freq: Every day | ORAL | 2 refills | Status: DC

## 2019-01-30 NOTE — Assessment & Plan Note (Signed)
Chronic and well controlled She has stopped SSRIs and is now feeling better since stopping Paxil Can continue Klonopin sparingly for anxiety episodes Still encouraged her to start Zoloft and this would be different than Paxil We again discussed risks of long-term benzos Will cut Rx to #30 and plan to not fill any more than once per month. (She still has medication, so will call when she is running low) F/u in 3-6 months

## 2019-08-21 ENCOUNTER — Encounter: Payer: Self-pay | Admitting: Family Medicine

## 2019-08-21 ENCOUNTER — Ambulatory Visit (INDEPENDENT_AMBULATORY_CARE_PROVIDER_SITE_OTHER): Payer: Self-pay | Admitting: Family Medicine

## 2019-08-21 DIAGNOSIS — R059 Cough, unspecified: Secondary | ICD-10-CM

## 2019-08-21 DIAGNOSIS — R0981 Nasal congestion: Secondary | ICD-10-CM

## 2019-08-21 DIAGNOSIS — R05 Cough: Secondary | ICD-10-CM

## 2019-08-21 MED ORDER — BENZONATATE 200 MG PO CAPS: 200.0000 mg | ORAL_CAPSULE | Freq: Two times a day (BID) | ORAL | 0 refills | Status: DC | PRN

## 2019-08-21 MED ORDER — AZITHROMYCIN 250 MG PO TABS: ORAL_TABLET | ORAL | 0 refills | Status: DC

## 2019-08-21 NOTE — Progress Notes (Signed)
MyChart Video Visit    Virtual Visit via Video Note   This visit type was conducted due to national recommendations for restrictions regarding the COVID-19 Pandemic (e.g. social distancing) in an effort to limit this patient's exposure and mitigate transmission in our community. This patient is at least at moderate risk for complications without adequate follow up. This format is felt to be most appropriate for this patient at this time. Physical exam was limited by quality of the video and audio technology used for the visit.   Patient location: Home Provider location: Office   Patient: Claudia Moses   DOB: 1970/12/25   49 y.o. Female  MRN: [id number] Visit Date: 08/21/2019  Today's healthcare provider: Dortha Kern, PA   Chief Complaint  Patient presents with  . URI   Subjective    HPI Upper respiratory symptoms She complains of congestion, ticklish cough, PND and scratchy throat.with fever to 99 degrees Fahrenheit. Onset of symptoms was 10 days ago and staying constant.She is drinking moderate amounts of fluids.  Past history is significant for no history of pneumonia or bronchitis. Patient is non-smoker  ---------------------------------------------------------------------------------------------------    Patient Active Problem List   Diagnosis Date Noted  . GAD (generalized anxiety disorder) 12/06/2018  . Bilateral kidney stones 06/13/2015  . Cervical pain (neck) 12/13/2014  . History of MRSA infection 10/20/2006   Past Surgical History:  Procedure Laterality Date  . CESAREAN SECTION  2001,2005  . TONSILLECTOMY AND ADENOIDECTOMY     Family History  Problem Relation Age of Onset  . Hypertension Mother   . Hypertension Father   . Heart disease Father   . Cancer Paternal Grandmother        breast   Social History   Tobacco Use  . Smoking status: Former Games developer  . Smokeless tobacco: Never Used  . Tobacco comment: quit in 2000  Substance Use Topics  .  Alcohol use: Yes    Alcohol/week: 0.0 standard drinks    Comment: occasional  . Drug use: No   Allergies  Allergen Reactions  . Amoxicillin-Pot Clavulanate   . Promethazine    Medications: Outpatient Medications Prior to Visit  Medication Sig  . [DISCONTINUED] clonazePAM (KLONOPIN) 1 MG tablet Take 1 tablet (1 mg total) by mouth 2 (two) times daily as needed for anxiety.  . [DISCONTINUED] meclizine (ANTIVERT) 12.5 MG tablet Take 1 tablet (12.5 mg total) by mouth 3 (three) times daily as needed for dizziness.  . [DISCONTINUED] meloxicam (MOBIC) 15 MG tablet Take 1 tablet (15 mg total) by mouth daily.  . [DISCONTINUED] sertraline (ZOLOFT) 50 MG tablet Take 1 tablet (50 mg total) by mouth daily.   No facility-administered medications prior to visit.   Review of Systems  HENT: Positive for congestion and sore throat.   Respiratory: Positive for cough.     Objective    LMP  (LMP Unknown)  BP Readings from Last 3 Encounters:  12/28/18 (!) 117/39  12/05/18 110/77  02/21/18 104/76   Wt Readings from Last 3 Encounters:  12/28/18 145 lb (65.8 kg)  12/05/18 159 lb 6.4 oz (72.3 kg)  02/21/18 149 lb (67.6 kg)   Physical Exam: WDWN female having a frequent ticklish hacking cough during video conference Head: Normocephalic, atraumatic. Complains of some pressure in sinuses around eyes. Neck: Supple, NROM Respiratory: No apparent distress Psych: Normal mood and affect   Assessment & Plan     1. Cough Onset over the past 10 days with PND and hacking  cough. No fever the past few days. Had some clear sputum and scratchy throat in the past few days. Temperature was up to 99 initially. No much relief from Mucinex-DM and Sudafed. Had finished COVID-19 vaccinations in April 2021. Will add antibiotic and Benzonatate to control cough. - benzonatate (TESSALON) 200 MG capsule; Take 1 capsule (200 mg total) by mouth 2 (two) times daily as needed for cough.  Dispense: 20 capsule; Refill: 0  2.  Sinus congestion Pressure around eyes. No fatigue or loss of taste/smell. No known exposure to COVID illness in others. Increase fluid intake and start Azithromycin to use with antihistamine for rhinorrhea, Mucinex-DM for cough and Sudafed if needed for headache from congestion. Recheck if no better in 5-7 days. - azithromycin (ZITHROMAX) 250 MG tablet; Take 2 tablets by mouth today then 1 daily for 4 days to complete a 10 course.  Dispense: 6 tablet; Refill: 0   No follow-ups on file.     I discussed the assessment and treatment plan with the patient. The patient was provided an opportunity to ask questions and all were answered. The patient agreed with the plan and demonstrated an understanding of the instructions.   The patient was advised to call back or seek an in-person evaluation if the symptoms worsen or if the condition fails to improve as anticipated.  I provided 15 minutes of non-face-to-face time during this encounter.  Haywood Pao, PA, have reviewed all documentation for this visit. The documentation on 08/21/19 for the exam, diagnosis, procedures, and orders are all accurate and complete.   Dortha Kern, PA [facility] [phone] (phone) [phone] (fax)  [facility]

## 2019-08-24 ENCOUNTER — Other Ambulatory Visit: Payer: Self-pay | Admitting: Family Medicine

## 2019-08-24 ENCOUNTER — Telehealth: Payer: Self-pay

## 2019-08-24 MED ORDER — HYDROCODONE-HOMATROPINE 5-1.5 MG/5ML PO SYRP: 5.0000 mL | ORAL_SOLUTION | Freq: Three times a day (TID) | ORAL | 0 refills | Status: DC | PRN

## 2019-08-24 NOTE — Telephone Encounter (Signed)
Sent Hycodan prescription to the pharmacy for persistent cough.

## 2019-08-24 NOTE — Telephone Encounter (Signed)
Copied from CRM [id number]. Topic: General - Inquiry >> Aug 24, 2019  9:19 AM Claudia Moses wrote: Reason for CRM: pt called in stated that the benzonatate (TESSALON) 200 MG capsule [[id number]]  is not working and would like to know if she could get some cough syrup called in .  She stated she can not sleep .   Ph[facility] [address]  Best number for pt -[phone]

## 2019-11-27 ENCOUNTER — Telehealth: Payer: Self-pay

## 2019-11-27 NOTE — Telephone Encounter (Signed)
Copied from CRM [id number]. Topic: General - Inquiry >> Nov 27, 2019 12:21 PM Leary Roca wrote: Reason for CRM: Pt called in stating she is having panic attacks again and would like to be prescribed a medication . Please advise

## 2019-11-27 NOTE — Telephone Encounter (Signed)
LOV 01/2019. Appt scheduled.

## 2019-11-27 NOTE — Telephone Encounter (Signed)
Copied from CRM [id number]. Topic: General - Inquiry >> Nov 26, 2019 10:37 AM Daphine Deutscher D wrote: Reason for CRM: Pt called saying she has been having panic attacks again and is asking if she can get a refill on Klonopin.  [facility] [address]  CB#  [phone]

## 2019-11-29 ENCOUNTER — Ambulatory Visit (INDEPENDENT_AMBULATORY_CARE_PROVIDER_SITE_OTHER): Payer: Self-pay | Admitting: Family Medicine

## 2019-11-29 ENCOUNTER — Other Ambulatory Visit: Payer: Self-pay

## 2019-11-29 ENCOUNTER — Encounter: Payer: Self-pay | Admitting: Family Medicine

## 2019-11-29 ENCOUNTER — Ambulatory Visit: Payer: Self-pay | Admitting: Family Medicine

## 2019-11-29 DIAGNOSIS — F411 Generalized anxiety disorder: Secondary | ICD-10-CM

## 2019-11-29 MED ORDER — BUSPIRONE HCL 5 MG PO TABS: 5.0000 mg | ORAL_TABLET | Freq: Three times a day (TID) | ORAL | 0 refills | Status: DC

## 2019-11-29 NOTE — Progress Notes (Deleted)
     Established patient visit   Patient: Claudia Moses   DOB: 1970/05/31   49 y.o. Female  MRN: [id number] Visit Date: 11/29/2019  Today's healthcare provider: Dortha Kern, PA   No chief complaint on file.  Subjective    HPI  Anxiety, Follow-up  She was last seen for anxiety 10 months ago. Changes made at last visit include starting Zoloft. Prescription quantity was also decreased to 30 tablets per month.    She reports {excellent/good/fair/poor:19665} compliance with treatment. She reports {good/fair/poor:18685} tolerance of treatment. She {is/is not:21021397} having side effects. {document side effects if present:1}  She feels her anxiety is {Desc; severity:60313} and {improved/worse/unchanged:3041574} since last visit.  Symptoms: {Yes/No:20286} chest pain {Yes/No:20286} difficulty concentrating  {Yes/No:20286} dizziness {Yes/No:20286} fatigue  {Yes/No:20286} feelings of losing control {Yes/No:20286} insomnia  {Yes/No:20286} irritable {Yes/No:20286} palpitations  {Yes/No:20286} panic attacks {Yes/No:20286} racing thoughts  {Yes/No:20286} shortness of breath {Yes/No:20286} sweating  {Yes/No:20286} tremors/shakes    GAD-7 Results GAD-7 Generalized Anxiety Disorder Screening Tool 12/05/2018  1. Feeling Nervous, Anxious, or on Edge 3  2. Not Being Able to Stop or Control Worrying 3  3. Worrying Too Much About Different Things 2  4. Trouble Relaxing 2  5. Being So Restless it's Hard To Sit Still 3  6. Becoming Easily Annoyed or Irritable 1  7. Feeling Afraid As If Something Awful Might Happen 0  Total GAD-7 Score 14  Difficulty At Work, Home, or Getting  Along With Others? Somewhat difficult    PHQ-9 Scores PHQ9 SCORE ONLY 12/05/2018 02/21/2018 10/11/2016  PHQ-9 Total Score 18 0 0    ---------------------------------------------------------------------------------------------------  {Show patient history (optional):23778::" "}   Medications: Outpatient  Medications Prior to Visit  Medication Sig  . azithromycin (ZITHROMAX) 250 MG tablet Take 2 tablets by mouth today then 1 daily for 4 days to complete a 10 course.  . benzonatate (TESSALON) 200 MG capsule Take 1 capsule (200 mg total) by mouth 2 (two) times daily as needed for cough.  Marland Kitchen HYDROcodone-homatropine (HYCODAN) 5-1.5 MG/5ML syrup Take 5 mLs by mouth every 8 (eight) hours as needed for cough.   No facility-administered medications prior to visit.    Review of Systems  Constitutional: Negative for appetite change, chills, fatigue and fever.  Respiratory: Negative for chest tightness and shortness of breath.   Cardiovascular: Negative for chest pain and palpitations.  Gastrointestinal: Negative for abdominal pain, nausea and vomiting.  Neurological: Negative for dizziness and weakness.  Psychiatric/Behavioral: The patient is nervous/anxious.     {Heme  Chem  Endocrine  Serology  Results Review (optional):23779::" "}  Objective    LMP  (LMP Unknown)  {Show previous vital signs (optional):23777::" "}  Physical Exam  ***  No results found for any visits on 11/29/19.  Assessment & Plan     ***  No follow-ups on file.      {provider attestation***:1}   Dortha Kern, PA  [facility] [phone] (phone) [phone] (fax)  [facility]

## 2019-11-29 NOTE — Progress Notes (Signed)
MyChart Video Visit    Virtual Visit via Video Note   This visit type was conducted due to national recommendations for restrictions regarding the COVID-19 Pandemic (e.g. social distancing) in an effort to limit this patient's exposure and mitigate transmission in our community. This patient is at least at moderate risk for complications without adequate follow up. This format is felt to be most appropriate for this patient at this time. Physical exam was limited by quality of the video and audio technology used for the visit.   Patient location: Home Provider location: Office  I discussed the limitations of evaluation and management by telemedicine and the availability of in person appointments. The patient expressed understanding and agreed to proceed.  Patient: Claudia Moses   DOB: 01/23/1971   49 y.o. Female  MRN: [id number] Visit Date: 11/29/2019  Today's healthcare provider: Dortha Kern, PA   Chief Complaint  Patient presents with  . Anxiety   Subjective    HPI  Anxiety, Follow-up  She was last seen for anxiety 10 months ago. Changes made at last visit include starting Zoloft. Prescription quantity was also decreased to 30 tablets per month.           She reports poor compliance with treatment. Patient states the Zoloft didn't work so she stopped taking it.    She feels her anxiety is moderate and Worse since last visit.  Symptoms: No chest pain No difficulty concentrating  No dizziness No fatigue  No feelings of losing control No insomnia  No irritable No palpitations  Yes panic attacks No racing thoughts  No shortness of breath No sweating  No tremors/shakes    GAD 7 : Generalized Anxiety Score 11/29/2019 12/05/2018  Nervous, Anxious, on Edge 2 3  Control/stop worrying 0 3  Worry too much - different things 2 2  Trouble relaxing 2 2  Restless 1 3  Easily annoyed or irritable 0 1  Afraid - awful might happen 0 0  Total GAD 7 Score 7 14  Anxiety  Difficulty Not difficult at all Somewhat difficult   . Depression screen [facility] 2/9 11/29/2019 12/05/2018 02/21/2018  Decreased Interest 0 1 0  Down, Depressed, Hopeless 0 1 0  PHQ - 2 Score 0 2 0  Altered sleeping 0 3 -  Tired, decreased energy 0 2 -  Change in appetite 0 3 -  Feeling bad or failure about yourself  0 2 -  Trouble concentrating 1 3 -  Moving slowly or fidgety/restless 0 3 -  Suicidal thoughts 0 0 -  PHQ-9 Score 1 18 -  Difficult doing work/chores Not difficult at all Somewhat difficult -     Past Medical History:  Diagnosis Date  . Anxiety   . Chronic sinusitis   . Menopausal disorder   . Ulnar tunnel syndrome    Past Surgical History:  Procedure Laterality Date  . CESAREAN SECTION  2001,2005  . TONSILLECTOMY AND ADENOIDECTOMY     Social History   Tobacco Use  . Smoking status: Former Games developer  . Smokeless tobacco: Never Used  . Tobacco comment: quit in 2000  Substance Use Topics  . Alcohol use: Yes    Alcohol/week: 0.0 standard drinks    Comment: occasional  . Drug use: No   Family Status  Relation Name Status  . Mother  Alive  . Father  Alive  . Brother  Alive  . PGM  Alive  . Brother  Alive   Allergies  Allergen  Reactions  . Amoxicillin-Pot Clavulanate   . Promethazine       Medications: Outpatient Medications Prior to Visit  Medication Sig  . azithromycin (ZITHROMAX) 250 MG tablet Take 2 tablets by mouth today then 1 daily for 4 days to complete a 10 course. (Patient not taking: Reported on 11/29/2019)  . benzonatate (TESSALON) 200 MG capsule Take 1 capsule (200 mg total) by mouth 2 (two) times daily as needed for cough. (Patient not taking: Reported on 11/29/2019)  . HYDROcodone-homatropine (HYCODAN) 5-1.5 MG/5ML syrup Take 5 mLs by mouth every 8 (eight) hours as needed for cough. (Patient not taking: Reported on 11/29/2019)   No facility-administered medications prior to visit.    Review of Systems  Constitutional: Negative for  appetite change, chills, fatigue and fever.  Respiratory: Negative for chest tightness and shortness of breath.   Cardiovascular: Negative for chest pain and palpitations.  Gastrointestinal: Negative for abdominal pain, nausea and vomiting.  Neurological: Negative for dizziness and weakness.      Objective    LMP  (LMP Unknown)    Physical Exam: WDWN female in no apparent distress.  Head: Normocephalic, atraumatic. Neck: Supple, NROM Respiratory: No apparent distress Psych: Normal mood and affect   Assessment & Plan     1. GAD (generalized anxiety disorder) Has had 4 episodes of feeling she was not taking a deep enough breath the past 2 weeks. She associates this with her anxiety disorder. Stresses include ending a relationship, child going off to college, etc. Denies panic or sleep disturbance. Did not feel SSRI's have been helpful. Doesn't want to take anything she has to take regularly. Will give trial of Buspar and advised to follow up with Dr. Beryle Flock.  - busPIRone (BUSPAR) 5 MG tablet; Take 1 tablet (5 mg total) by mouth 3 (three) times daily.  Dispense: 30 tablet; Refill: 0   No follow-ups on file.     I discussed the assessment and treatment plan with the patient. The patient was provided an opportunity to ask questions and all were answered. The patient agreed with the plan and demonstrated an understanding of the instructions.   The patient was advised to call back or seek an in-person evaluation if the symptoms worsen or if the condition fails to improve as anticipated.  I provided 20 minutes of non-face-to-face time during this encounter.  Haywood Pao, PA, have reviewed all documentation for this visit. The documentation on 11/29/19 for the exam, diagnosis, procedures, and orders are all accurate and complete.   Dortha Kern, PA [facility] [phone] (phone) [phone] (fax)  [facility]
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
## 2020-05-26 ENCOUNTER — Telehealth: Payer: Self-pay | Admitting: Family Medicine

## 2020-05-26 MED ORDER — MECLIZINE HCL 12.5 MG PO TABS: 12.5000 mg | ORAL_TABLET | Freq: Three times a day (TID) | ORAL | 0 refills | Status: DC | PRN

## 2020-05-26 NOTE — Telephone Encounter (Signed)
Medication Refill - Medication: meclizine (ANTIVERT) 12.5 MG tablet   Pt is flying out of the country on Wednesday, and is completely out of current supply    Has the patient contacted their pharmacy? No. (Agent: If no, request that the patient contact the pharmacy for the refill.) (Agent: If yes, when and what did the pharmacy advise?)  Preferred Pharmacy (with phone number or street name):  [facility] - [place], [place] - [address]  [address] [place] [address]  Phone: [phone] Fax: [phone]     Agent: Please be advised that RX refills may take up to 3 business days. We ask that you follow-up with your pharmacy.

## 2020-05-26 NOTE — Telephone Encounter (Signed)
Medication that is requested is not on current medication list. Please advise

## 2020-05-26 NOTE — Telephone Encounter (Signed)
Ok to send in Rx for TID prn #[id number]

## 2021-01-05 ENCOUNTER — Telehealth: Payer: Self-pay

## 2021-01-05 NOTE — Telephone Encounter (Signed)
Copied from CRM [id number]. Topic: Appointment Scheduling - Scheduling Inquiry for Clinic >> Jan 05, 2021  1:54 PM Daphine Deutscher D wrote: Pt called wanting to see someone from the office to get back on medication for anxiety.  The first one I could schedule was Dec.  CB#  [phone]

## 2021-01-06 NOTE — Telephone Encounter (Signed)
Patient scheduled for 01/14/21 at 1:40pm

## 2021-01-14 ENCOUNTER — Encounter: Payer: Self-pay | Admitting: Family Medicine

## 2021-01-14 ENCOUNTER — Other Ambulatory Visit: Payer: Self-pay

## 2021-01-14 ENCOUNTER — Ambulatory Visit (INDEPENDENT_AMBULATORY_CARE_PROVIDER_SITE_OTHER): Payer: Self-pay | Admitting: Family Medicine

## 2021-01-14 VITALS — BP 119/65 | HR 62 | Resp 16 | Wt 139.2 lb

## 2021-01-14 DIAGNOSIS — F411 Generalized anxiety disorder: Secondary | ICD-10-CM

## 2021-01-14 MED ORDER — CLONAZEPAM 0.5 MG PO TABS: 0.2500 mg | ORAL_TABLET | Freq: Every day | ORAL | 0 refills | Status: DC | PRN

## 2021-01-14 NOTE — Assessment & Plan Note (Signed)
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

## 2021-01-14 NOTE — Progress Notes (Signed)
Established patient visit   Patient: Claudia Moses   DOB: 1970-03-27   50 y.o. Female  MRN: [id number] Visit Date: 01/14/2021  Today's healthcare provider: Jacky Kindle, FNP   Chief Complaint  Patient presents with   Anxiety   Subjective    HPI  Anxiety, Follow-up  She was last seen for anxiety 1 months ago. Changes made at last visit include started on Buspar 5mg .   She reports poor compliance with treatment.Patient states that she d/c medication after 3 weeks She reports good to fair tolerance of treatment. She is having side effects. Patient reports difficulty taking a deep breath and states that she had to breath deeply when on medication.   She feels her anxiety is moderate and Worse since last visit.Patient states that she is about to loss her job in 6 months, daughter is graduating and moving out of home  Symptoms: No chest pain Yes difficulty concentrating  No dizziness Yes fatigue  No feelings of losing control Yes insomnia  No irritable No palpitations  No panic attacks No racing thoughts  Yes shortness of breath No sweating  No tremors/shakes    GAD-7 Results GAD-7 Generalized Anxiety Disorder Screening Tool 11/29/2019 12/05/2018  1. Feeling Nervous, Anxious, or on Edge 2 3  2. Not Being Able to Stop or Control Worrying 0 3  3. Worrying Too Much About Different Things 2 2  4. Trouble Relaxing 2 2  5. Being So Restless it's Hard To Sit Still 1 3  6. Becoming Easily Annoyed or Irritable 0 1  7. Feeling Afraid As If Something Awful Might Happen 0 0  Total GAD-7 Score 7 14  Difficulty At Work, Home, or Getting  Along With Others? Not difficult at all Somewhat difficult    PHQ-9 Scores PHQ9 SCORE ONLY 01/14/2021 11/29/2019 12/05/2018  PHQ-9 Total Score 6 1 18     ---------------------------------------------------------------------------------------------------   Medications: Outpatient Medications Prior to Visit  Medication Sig   [DISCONTINUED]  azithromycin (ZITHROMAX) 250 MG tablet Take 2 tablets by mouth today then 1 daily for 4 days to complete a 10 course.   [DISCONTINUED] benzonatate (TESSALON) 200 MG capsule Take 1 capsule (200 mg total) by mouth 2 (two) times daily as needed for cough.   [DISCONTINUED] busPIRone (BUSPAR) 5 MG tablet Take 1 tablet (5 mg total) by mouth 3 (three) times daily.   [DISCONTINUED] HYDROcodone-homatropine (HYCODAN) 5-1.5 MG/5ML syrup Take 5 mLs by mouth every 8 (eight) hours as needed for cough.   [DISCONTINUED] meclizine (ANTIVERT) 12.5 MG tablet Take 1 tablet (12.5 mg total) by mouth 3 (three) times daily as needed for dizziness.   No facility-administered medications prior to visit.    Review of Systems     Objective    BP 119/65   Pulse 62   Resp 16   Wt 139 lb 3.2 oz (63.1 kg)   LMP  (LMP Unknown)   SpO2 97%   BMI 24.66 kg/m    Physical Exam Vitals and nursing note reviewed.  Constitutional:      General: She is not in acute distress.    Appearance: Normal appearance. She is normal weight. She is not ill-appearing, toxic-appearing or diaphoretic.  HENT:     Head: Normocephalic and atraumatic.  Cardiovascular:     Rate and Rhythm: Normal rate and regular rhythm.     Pulses: Normal pulses.     Heart sounds: Normal heart sounds. No murmur heard.   No friction rub.  No gallop.  Pulmonary:     Effort: Pulmonary effort is normal. No respiratory distress.     Breath sounds: Normal breath sounds. No stridor. No wheezing, rhonchi or rales.  Chest:     Chest wall: No tenderness.  Abdominal:     General: Bowel sounds are normal.     Palpations: Abdomen is soft.  Musculoskeletal:        General: No swelling, tenderness, deformity or signs of injury. Normal range of motion.     Right lower leg: No edema.     Left lower leg: No edema.  Skin:    General: Skin is warm and dry.     Capillary Refill: Capillary refill takes less than 2 seconds.     Coloration: Skin is not jaundiced or  pale.     Findings: No bruising, erythema, lesion or rash.  Neurological:     General: No focal deficit present.     Mental Status: She is alert and oriented to person, place, and time. Mental status is at baseline.     Cranial Nerves: No cranial nerve deficit.     Sensory: No sensory deficit.     Motor: No weakness.     Coordination: Coordination normal.  Psychiatric:        Mood and Affect: Mood is anxious.        Behavior: Behavior normal.        Thought Content: Thought content normal.        Judgment: Judgment normal.      No results found for any visits on 01/14/21.  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     Leilani Merl, FNP, have reviewed all documentation for this visit. The documentation on 01/14/21 for the exam, diagnosis, procedures, and orders are all accurate and complete.    Jacky Kindle, FNP  [facility] [phone] (phone) [phone] (fax)  [facility]

## 2021-05-05 ENCOUNTER — Other Ambulatory Visit: Payer: Self-pay | Admitting: Family Medicine

## 2021-05-05 NOTE — Telephone Encounter (Signed)
Medication Refill - Medication: clonazePAM (KLONOPIN) 0.5 MG tablet ? ? ?Has the patient contacted their pharmacy? No. ?(Agent: If no, request that the patient contact the pharmacy for the refill. If patient does not wish to contact the pharmacy document the reason why and proceed with request.) ?(Agent: If yes, when and what did the pharmacy advise?) ? ?Preferred Pharmacy (with phone number or street name): [facility]  ? ?Has the patient been seen for an appointment in the last year OR does the patient have an upcoming appointment? Yes.   In November  ? ?Agent: Please be advised that RX refills may take up to 3 business days. We ask that you follow-up with your pharmacy. ?

## 2021-05-05 NOTE — Telephone Encounter (Signed)
Requested medication (s) are due for refill today: yes ? ?Requested medication (s) are on the active medication list: yes ? ?Last refill:  01/14/21 #20 with 0 RF ? ?Future visit scheduled: no, was asked to return in 3 months, February, no upcoming appt scheduled. ? ?Notes to clinic:  This medication can not be delegated, please assess.  ? ? ? ?  ? ?Requested Prescriptions  ?Pending Prescriptions Disp Refills  ? clonazePAM (KLONOPIN) 0.5 MG tablet 20 tablet 0  ?  Sig: Take 0.5 tablets (0.25 mg total) by mouth daily as needed for anxiety.  ?  ? Not Delegated - Psychiatry: Anxiolytics/Hypnotics 2 Failed - 05/05/2021 12:31 PM  ?  ?  Failed - This refill cannot be delegated  ?  ?  Failed - Urine Drug Screen completed in last 360 days  ?  ?  Passed - Patient is not pregnant  ?  ?  Passed - Valid encounter within last 6 months  ?  Recent Outpatient Visits   ? ?      ? 3 months ago GAD (generalized anxiety disorder)  ? [facility] Tally Joe T, FNP  ? 1 year ago GAD (generalized anxiety disorder)  ? [place], PA-C  ? 1 year ago Cough  ? [place], PA-C  ? 2 years ago GAD (generalized anxiety disorder)  ? [facility] Pajaros, Dionne Bucy, MD  ? 2 years ago GAD (generalized anxiety disorder)  ? [facility] Bacigalupo, Dionne Bucy, MD  ? ?  ?  ? ?  ?  ?  ? ? ?

## 2021-05-08 ENCOUNTER — Other Ambulatory Visit: Payer: Self-pay

## 2021-05-08 NOTE — Telephone Encounter (Signed)
Copied from CRM [id number]. Topic: General - Other ?>> May 08, 2021  1:21 PM Gaetana Michaelis A wrote: ?Reason for CRM: The patient would like to speak with a member of staff about their previously requested refill of clonazePAM (KLONOPIN) 0.5 MG tablet [[id number]]  ? ?Please contact further when available ?

## 2021-05-11 NOTE — Telephone Encounter (Signed)
Looks like Rx was removed after message was forwarded. Patient was given 1 time Rx for clonazepam by Robynn Pane in 11/22. This is not a long term chronic med.  She is due for follow-up appt for GAD. ?

## 2021-05-11 NOTE — Telephone Encounter (Signed)
Patient advised as below. Patient refused to schedule appt and refuses to start another medication. ?

## 2021-05-11 NOTE — Telephone Encounter (Signed)
Patient reports that she does not need it at this time however would like to keep some on hand as she is traveling out of the country.  ?

## 2022-01-22 DIAGNOSIS — Z419 Encounter for procedure for purposes other than remedying health state, unspecified: Secondary | ICD-10-CM | POA: Diagnosis not present

## 2022-02-22 DIAGNOSIS — Z419 Encounter for procedure for purposes other than remedying health state, unspecified: Secondary | ICD-10-CM | POA: Diagnosis not present

## 2022-03-25 DIAGNOSIS — Z419 Encounter for procedure for purposes other than remedying health state, unspecified: Secondary | ICD-10-CM | POA: Diagnosis not present

## 2022-04-23 DIAGNOSIS — Z419 Encounter for procedure for purposes other than remedying health state, unspecified: Secondary | ICD-10-CM | POA: Diagnosis not present

## 2022-05-24 DIAGNOSIS — Z419 Encounter for procedure for purposes other than remedying health state, unspecified: Secondary | ICD-10-CM | POA: Diagnosis not present

## 2022-06-23 DIAGNOSIS — Z419 Encounter for procedure for purposes other than remedying health state, unspecified: Secondary | ICD-10-CM | POA: Diagnosis not present

## 2022-07-24 DIAGNOSIS — Z419 Encounter for procedure for purposes other than remedying health state, unspecified: Secondary | ICD-10-CM | POA: Diagnosis not present

## 2022-08-23 DIAGNOSIS — Z419 Encounter for procedure for purposes other than remedying health state, unspecified: Secondary | ICD-10-CM | POA: Diagnosis not present

## 2022-09-23 DIAGNOSIS — Z419 Encounter for procedure for purposes other than remedying health state, unspecified: Secondary | ICD-10-CM | POA: Diagnosis not present

## 2022-10-24 DIAGNOSIS — Z419 Encounter for procedure for purposes other than remedying health state, unspecified: Secondary | ICD-10-CM | POA: Diagnosis not present

## 2022-11-23 DIAGNOSIS — Z419 Encounter for procedure for purposes other than remedying health state, unspecified: Secondary | ICD-10-CM | POA: Diagnosis not present

## 2022-12-24 DIAGNOSIS — Z419 Encounter for procedure for purposes other than remedying health state, unspecified: Secondary | ICD-10-CM | POA: Diagnosis not present

## 2023-01-23 DIAGNOSIS — Z419 Encounter for procedure for purposes other than remedying health state, unspecified: Secondary | ICD-10-CM | POA: Diagnosis not present

## 2023-02-23 DIAGNOSIS — Z419 Encounter for procedure for purposes other than remedying health state, unspecified: Secondary | ICD-10-CM | POA: Diagnosis not present

## 2023-03-26 DIAGNOSIS — Z419 Encounter for procedure for purposes other than remedying health state, unspecified: Secondary | ICD-10-CM | POA: Diagnosis not present

## 2023-04-23 DIAGNOSIS — Z419 Encounter for procedure for purposes other than remedying health state, unspecified: Secondary | ICD-10-CM | POA: Diagnosis not present

## 2023-07-04 DIAGNOSIS — Z419 Encounter for procedure for purposes other than remedying health state, unspecified: Secondary | ICD-10-CM | POA: Diagnosis not present

## 2023-07-19 DIAGNOSIS — J00 Acute nasopharyngitis [common cold]: Secondary | ICD-10-CM | POA: Diagnosis not present

## 2023-07-19 DIAGNOSIS — J209 Acute bronchitis, unspecified: Secondary | ICD-10-CM | POA: Diagnosis not present

## 2023-07-19 NOTE — Telephone Encounter (Signed)
 Chief Complaint: Dry cough,  Symptoms: cough, nasal drainage, low grade fever of 99 at one point a few days ago Frequency: 4-5 days Pertinent Negatives: Patient denies difficulty breathing, chest pain, nausea, vomiting, diarrhea, ear aches, sore throat Disposition: [] ED /[x] Urgent Care (no appt availability in office) / [] Appointment(In office/virtual)/ []  [facility]/ [] Home Care/ [] Refused Recommended Disposition /[]  Mobile Bus/ []  Follow-up with PCP Additional Notes: Patient called and advised that for the past 4-5 days she has not been feeling well. Patient has had a dry cough that is very persistent.  Patient coughed multiple times during triage with this RN.  Patient denies difficulty breathing, chest pain, nausea, vomiting, diarrhea, ear aches, sore throat.  Patient is advised that the recommendation at this time is that she is seen and evaluated in the next 24 hours. There is no availability in the patient's PCP office in the next 24 hours.  Patient is advised of appropriate next steps, including Care Advice as per protocol and Urgent Cares being an alternative at this time. Patient states that she will go to an Urgent Care to be seen and evaluated at this time. Patient is advised that if anything worsens to go to the Emergency Room. Caller verbalized understanding and agreed with plan.   Reason for Disposition  [1] Continuous (nonstop) coughing interferes with work or school AND [2] no improvement using cough treatment per Care Advice  Answer Assessment - Initial Assessment Questions 1. ONSET: "When did the cough begin?"      4-5 days ago 2. SEVERITY: "How bad is the cough today?"      persistent 3. SPUTUM: "Describe the color of your sputum" (none, dry cough; clear, white, yellow, green)     --- 4. HEMOPTYSIS: "Are you coughing up any blood?" If so ask: "How much?" (flecks, streaks, tablespoons, etc.)     No 5. DIFFICULTY BREATHING: "Are you having  difficulty breathing?" If Yes, ask: "How bad is it?" (e.g., mild, moderate, severe)    - MILD: No SOB at rest, mild SOB with walking, speaks normally in sentences, can lie down, no retractions, pulse < 100.    - MODERATE: SOB at rest, SOB with minimal exertion and prefers to sit, cannot lie down flat, speaks in phrases, mild retractions, audible wheezing, pulse 100-120.    - SEVERE: Very SOB at rest, speaks in single words, struggling to breathe, sitting hunched forward, retractions, pulse > 120      No 6. FEVER: "Do you have a fever?" If Yes, ask: "What is your temperature, how was it measured, and when did it start?"     A few days ago around 99----back down to normal since then 7. CARDIAC HISTORY: "Do you have any history of heart disease?" (e.g., heart attack, congestive heart failure)      No 8. LUNG HISTORY: "Do you have any history of lung disease?"  (e.g., pulmonary embolus, asthma, emphysema)     No 9. PE RISK FACTORS: "Do you have a history of blood clots?" (or: recent major surgery, recent prolonged travel, bedridden)     No 10. OTHER SYMPTOMS: "Do you have any other symptoms?" (e.g., runny nose, wheezing, chest pain)       Nasal drainage, low grade fever 11. PREGNANCY: "Is there any chance you are pregnant?" "When was your last menstrual period?"       ---- 12. TRAVEL: "Have you traveled out of the country in the last month?" (e.g., travel history, exposures)       ----  Protocols used: Cough - Acute Non-Productive-A-AH

## 2023-07-19 NOTE — Telephone Encounter (Signed)
 This encounter was created in error - please disregard.

## 2023-07-19 NOTE — Telephone Encounter (Signed)
 LVM for patient to return call to [phone]  Summary: sinus infection   Copied From CRM [id number]. Reason for Triage: Sinus infection. Cough, congestion, low grade temperature for 4-5 days and would like medication called in to pharmacy.  Callback #: [phone]  Preferred Pharmacy: [facility] - [place], [place] - [address] [place] [address] Phone: [phone] Fax: [phone] Hours: Not open 24 hours

## 2023-08-04 DIAGNOSIS — Z419 Encounter for procedure for purposes other than remedying health state, unspecified: Secondary | ICD-10-CM | POA: Diagnosis not present

## 2023-09-03 DIAGNOSIS — Z419 Encounter for procedure for purposes other than remedying health state, unspecified: Secondary | ICD-10-CM | POA: Diagnosis not present

## 2023-09-15 ENCOUNTER — Ambulatory Visit: Admitting: Physician Assistant

## 2023-09-15 ENCOUNTER — Telehealth: Payer: Self-pay

## 2023-09-15 NOTE — Telephone Encounter (Signed)
 Copied from CRM [id number]. Topic: Appointments - Appointment Scheduling >> Sep 14, 2023  4:39 PM Delon DASEN wrote: Patient returning call about rescheduling appt for chest and jaw pain- nothing available until late August- [phone]

## 2023-09-15 NOTE — Telephone Encounter (Signed)
 Called patient and rescheduled her for 09/20/23 at 8 am with Ostwalt Janna.  Patient states that she had one episode of chest pain and jaw pain on Sunday lasting 15-20 minutes. No more episodes since.she has started taking baby Aspirin since. Patient was advised to go to the emergency department if she starts to feel like this again otherwise we will see her at her appointment. Patient verbalized understanding and agrees with plan.

## 2023-09-15 NOTE — Telephone Encounter (Signed)
 Noted

## 2023-09-18 NOTE — Progress Notes (Addendum)
 " Established patient visit  Patient: Claudia Moses   DOB: Nov 09, 1970   53 y.o. Female  MRN: [id number] Visit Date: 09/20/2023  Today's healthcare provider: Jolynn Spencer, PA-C   Chief Complaint  Patient presents with   Chest Pain    On Sunday had a sharp stabbing pain in mid chest, r cheek/jaw pain that lasted 15-20 mins that happened 1 week ago. Since has been taking a baby aspirin daily.    Subjective     Discussed the use of AI scribe software for clinical note transcription with the patient, who gave verbal consent to proceed.  History of Present Illness Claudia Moses is a 53 year old female who presents with chest pain.  She experiences sudden, stabbing chest pain while driving, with pain radiating to the jaw and lasting 15 to 20 minutes. The pain is rated 6 out of 10 and is accompanied by nausea. There is no radiation or change with movement. She has no history of similar chest pain, hypertension, or cardiac issues, except for gestational hypertension. She has a history of anxiety and discontinued Klonopin  in 2022. Occasional acid reflux occurs, particularly after consuming tomatoes, but this episode felt different. Her grandmother died of a heart attack at 38, and her grandfather died of a stroke. She has not had a physical exam since 2018. She reports no shortness of breath, vomiting, sweating, or changes in diet or sleep patterns prior to the episode.      09/20/2023    8:21 AM 11/29/2019   10:34 AM 12/05/2018    8:30 AM  GAD 7 : Generalized Anxiety Score  Nervous, Anxious, on Edge 3 2 3   Control/stop worrying 1 0 3  Worry too much - different things 0 2 2  Trouble relaxing 3 2 2   Restless 3 1 3   Easily annoyed or irritable 0 0 1  Afraid - awful might happen 0 0 0  Total GAD 7 Score 10 7 14   Anxiety Difficulty Somewhat difficult Not difficult at all Somewhat difficult       09/20/2023    8:20 AM 01/14/2021    1:58 PM 11/29/2019   10:33 AM  PHQ9 SCORE ONLY  PHQ-9 Total  Score 7 6 1        09/20/2023    8:20 AM 01/14/2021    1:58 PM 11/29/2019   10:33 AM  Depression screen PHQ 2/9  Decreased Interest 0 0 0  Down, Depressed, Hopeless 0 0 0  PHQ - 2 Score 0 0 0  Altered sleeping 3 2 0  Tired, decreased energy 1 0 0  Change in appetite 2 2 0  Feeling bad or failure about yourself  0 0 0  Trouble concentrating 1 2 1   Moving slowly or fidgety/restless 0 0 0  Suicidal thoughts 0 0 0  PHQ-9 Score 7 6 1   Difficult doing work/chores Somewhat difficult Not difficult at all Not difficult at all      09/20/2023    8:21 AM 11/29/2019   10:34 AM 12/05/2018    8:30 AM  GAD 7 : Generalized Anxiety Score  Nervous, Anxious, on Edge 3 2 3   Control/stop worrying 1 0 3  Worry too much - different things 0 2 2  Trouble relaxing 3 2 2   Restless 3 1 3   Easily annoyed or irritable 0 0 1  Afraid - awful might happen 0 0 0  Total GAD 7 Score 10 7 14   Anxiety Difficulty Somewhat difficult Not difficult  at all Somewhat difficult    Medications: Outpatient Medications Prior to Visit  Medication Sig   [id number]  (KLONOPIN ) 0.5 MG tablet Take 0.5 tablets (0.25 mg total) by mouth daily as needed for anxiety.   No facility-administered medications prior to visit.    Review of Systems  All other systems reviewed and are negative.  All negative Except see HPI       Objective    BP 123/74 (BP Location: Right Arm, Patient Position: Sitting, Cuff Size: Normal)   Pulse 73   Resp 14   Ht 5' 2 (1.575 m)   Wt 144 lb (65.3 kg)   LMP  (LMP Unknown)   SpO2 99%   BMI 26.34 kg/m     Physical Exam Vitals reviewed.  Constitutional:      General: She is not in acute distress.    Appearance: Normal appearance. She is well-developed and normal weight. She is not diaphoretic.  HENT:     Head: Normocephalic and atraumatic.     Right Ear: Ear canal and external ear normal.     Left Ear: Ear canal and external ear normal.     Nose: Congestion and rhinorrhea present.      Mouth/Throat:     Pharynx: Posterior oropharyngeal erythema present.     Comments: Postnasal drainage noted Eyes:     General: No scleral icterus.       Right eye: No discharge.        Left eye: No discharge.     Extraocular Movements: Extraocular movements intact.     Conjunctiva/sclera: Conjunctivae normal.     Pupils: Pupils are equal, round, and reactive to light.  Neck:     Thyroid: No thyromegaly.  Cardiovascular:     Rate and Rhythm: Normal rate and regular rhythm.     Pulses: Normal pulses.     Heart sounds: Normal heart sounds. No murmur heard. Pulmonary:     Effort: Pulmonary effort is normal. No respiratory distress.     Breath sounds: Normal breath sounds. No wheezing, rhonchi or rales.  Abdominal:     General: Abdomen is flat. Bowel sounds are normal.     Palpations: Abdomen is soft.  Musculoskeletal:     Cervical back: Neck supple.     Right lower leg: No edema.     Left lower leg: No edema.  Lymphadenopathy:     Cervical: No cervical adenopathy.  Skin:    General: Skin is warm and dry.     Findings: No rash.  Neurological:     Mental Status: She is alert and oriented to person, place, and time. Mental status is at baseline.  Psychiatric:        Mood and Affect: Mood normal.        Behavior: Behavior normal.      No results found for any visits on 09/20/23.       Assessment & Plan Acute chest pain with associated jaw pain and nausea Denies cp, sob, palpitation since the first episode. Considered anxiety-related symptoms. Family history of heart disease noted. - Perform EKG. It showed low EKG voltage, NSR - Order CBC, CMP, lipid panel, thyroid function tests, and D-dimer. - Schedule follow-up for evaluation and test reviews with PCP. Advised to closely monitor vitals.  Pt requested a workup for assessment Will follow-up  Anxiety disorder Chronic    09/20/2023    8:21 AM 11/29/2019   10:34 AM 12/05/2018    8:30 AM  GAD 7 :  Generalized Anxiety  Score  Nervous, Anxious, on Edge 3 2 3   Control/stop worrying 1 0 3  Worry too much - different things 0 2 2  Trouble relaxing 3 2 2   Restless 3 1 3   Easily annoyed or irritable 0 0 1  Afraid - awful might happen 0 0 0  Total GAD 7 Score 10 7 14   Anxiety Difficulty Somewhat difficult Not difficult at all Somewhat difficult       09/20/2023    8:20 AM 01/14/2021    1:58 PM 11/29/2019   10:33 AM  PHQ9 SCORE ONLY  PHQ-9 Total Score 7 6 1    High anxiety levels potentially contributing to chest pain. Not on antidepressants Will follow-up  Seasonal allergic rhinitis Mild symptoms of congestion and drainage. - Avoidance measures discussed. - Use otc nasal saline rinses before nose sprays such as with Neilmed Sinus Rinse bottle.  Use distilled water.   - Use otc Flonase 2 sprays each nostril daily. Aim upward and outward. - Use otc Zyrtec 10 mg daily.  Will follow-up  No orders of the defined types were placed in this encounter.   No follow-ups on file.   The patient was advised to call back or seek an in-person evaluation if the symptoms worsen or if the condition fails to improve as anticipated.  I discussed the assessment and treatment plan with the patient. The patient was provided an opportunity to ask questions and all were answered. The patient agreed with the plan and demonstrated an understanding of the instructions.  I, Sukanya Goldblatt, PA-C have reviewed all documentation for this visit. The documentation on 09/20/2023  for the exam, diagnosis, procedures, and orders are all accurate and complete.  Jolynn Spencer, [facility], MMS [facility] [phone] (phone) [phone] (fax)  [facility] "

## 2023-09-20 ENCOUNTER — Ambulatory Visit (INDEPENDENT_AMBULATORY_CARE_PROVIDER_SITE_OTHER): Admitting: Physician Assistant

## 2023-09-20 ENCOUNTER — Encounter: Payer: Self-pay | Admitting: Physician Assistant

## 2023-09-20 ENCOUNTER — Encounter: Admitting: Family Medicine

## 2023-09-20 VITALS — BP 123/74 | HR 73 | Resp 14 | Ht 62.0 in | Wt 144.0 lb

## 2023-09-20 DIAGNOSIS — J3089 Other allergic rhinitis: Secondary | ICD-10-CM | POA: Diagnosis not present

## 2023-09-20 DIAGNOSIS — R6884 Jaw pain: Secondary | ICD-10-CM | POA: Diagnosis not present

## 2023-09-20 DIAGNOSIS — R0789 Other chest pain: Secondary | ICD-10-CM

## 2023-09-20 DIAGNOSIS — F411 Generalized anxiety disorder: Secondary | ICD-10-CM

## 2023-09-21 ENCOUNTER — Ambulatory Visit: Payer: Self-pay | Admitting: Physician Assistant

## 2023-09-21 DIAGNOSIS — J309 Allergic rhinitis, unspecified: Secondary | ICD-10-CM | POA: Insufficient documentation

## 2023-09-21 DIAGNOSIS — R0789 Other chest pain: Secondary | ICD-10-CM | POA: Insufficient documentation

## 2023-09-21 DIAGNOSIS — R6884 Jaw pain: Secondary | ICD-10-CM | POA: Insufficient documentation

## 2023-09-21 LAB — CBC WITH DIFFERENTIAL/PLATELET
Basophils Absolute: 0 x10E3/uL (ref 0.0–0.2)
Basos: 1 %
EOS (ABSOLUTE): 0.2 x10E3/uL (ref 0.0–0.4)
Eos: 5 %
Hematocrit: 42.3 % (ref 34.0–46.6)
Hemoglobin: 13.9 g/dL (ref 11.1–15.9)
Immature Grans (Abs): 0 x10E3/uL (ref 0.0–0.1)
Immature Granulocytes: 0 %
Lymphocytes Absolute: 1.5 x10E3/uL (ref 0.7–3.1)
Lymphs: 35 %
MCH: 30.9 pg (ref 26.6–33.0)
MCHC: 32.9 g/dL (ref 31.5–35.7)
MCV: 94 fL (ref 79–97)
Monocytes Absolute: 0.4 x10E3/uL (ref 0.1–0.9)
Monocytes: 9 %
Neutrophils Absolute: 2.2 x10E3/uL (ref 1.4–7.0)
Neutrophils: 49 %
Platelets: 278 x10E3/uL (ref 150–450)
RBC: 4.5 x10E6/uL (ref 3.77–5.28)
RDW: 12.8 % (ref 11.7–15.4)
WBC: 4.4 x10E3/uL (ref 3.4–10.8)

## 2023-09-21 LAB — COMPREHENSIVE METABOLIC PANEL WITH GFR
ALT: 16 IU/L (ref 0–32)
AST: 18 IU/L (ref 0–40)
Albumin: 4.3 g/dL (ref 3.8–4.9)
Alkaline Phosphatase: 70 IU/L (ref 44–121)
BUN/Creatinine Ratio: 17 (ref 9–23)
BUN: 14 mg/dL (ref 6–24)
Bilirubin Total: 0.4 mg/dL (ref 0.0–1.2)
CO2: 19 mmol/L — ABNORMAL LOW (ref 20–29)
Calcium: 9.3 mg/dL (ref 8.7–10.2)
Chloride: 105 mmol/L (ref 96–106)
Creatinine, Ser: 0.81 mg/dL (ref 0.57–1.00)
Globulin, Total: 2.4 g/dL (ref 1.5–4.5)
Glucose: 89 mg/dL (ref 70–99)
Potassium: 4.5 mmol/L (ref 3.5–5.2)
Sodium: 141 mmol/L (ref 134–144)
Total Protein: 6.7 g/dL (ref 6.0–8.5)
eGFR: 87 mL/min/1.73 (ref >59)

## 2023-09-21 LAB — D-DIMER, QUANTITATIVE: D-DIMER: <0.2 mg{FEU}/L (ref 0.00–0.49)

## 2023-09-21 LAB — TSH: TSH: 1.36 u[IU]/mL (ref 0.450–4.500)

## 2023-09-21 LAB — LIPID PANEL
Chol/HDL Ratio: 2.4 ratio (ref 0.0–4.4)
Cholesterol, Total: 209 mg/dL — ABNORMAL HIGH (ref 100–199)
HDL: 87 mg/dL (ref >39)
LDL Chol Calc (NIH): 113 mg/dL — ABNORMAL HIGH (ref 0–99)
Triglycerides: 49 mg/dL (ref 0–149)
VLDL Cholesterol Cal: 9 mg/dL (ref 5–40)

## 2023-09-21 LAB — TROPONIN T: Troponin T (Highly Sensitive): <6 ng/L (ref 0–14)

## 2023-10-04 DIAGNOSIS — Z419 Encounter for procedure for purposes other than remedying health state, unspecified: Secondary | ICD-10-CM | POA: Diagnosis not present

## 2023-10-18 ENCOUNTER — Encounter: Payer: Self-pay | Admitting: Family Medicine

## 2023-10-18 ENCOUNTER — Other Ambulatory Visit (HOSPITAL_COMMUNITY)
Admission: RE | Admit: 2023-10-18 | Discharge: 2023-10-18 | Disposition: A | Discharge status: Home / Self Care | Source: Ambulatory Visit | Attending: Family Medicine | Admitting: Family Medicine

## 2023-10-18 ENCOUNTER — Ambulatory Visit: Admitting: Family Medicine

## 2023-10-18 VITALS — BP 111/71 | HR 66 | Temp 98.4°F | Ht 62.5 in | Wt 141.9 lb

## 2023-10-18 DIAGNOSIS — Z1231 Encounter for screening mammogram for malignant neoplasm of breast: Secondary | ICD-10-CM

## 2023-10-18 DIAGNOSIS — Z124 Encounter for screening for malignant neoplasm of cervix: Secondary | ICD-10-CM

## 2023-10-18 DIAGNOSIS — Z23 Encounter for immunization: Secondary | ICD-10-CM | POA: Diagnosis not present

## 2023-10-18 DIAGNOSIS — Z0000 Encounter for general adult medical examination without abnormal findings: Secondary | ICD-10-CM | POA: Diagnosis not present

## 2023-10-18 DIAGNOSIS — Z1211 Encounter for screening for malignant neoplasm of colon: Secondary | ICD-10-CM

## 2023-10-18 NOTE — Patient Instructions (Signed)
 Call [facility] to schedule a mammogram [phone]

## 2023-10-18 NOTE — Progress Notes (Signed)
 Complete physical exam   Patient: Claudia Moses   DOB: 1970-11-29   53 y.o. Female  MRN: [id number] Visit Date: 10/18/2023  Today's healthcare provider: Jon Eva, MD   Chief Complaint  Patient presents with   Annual Exam    Diet- General Exercise- Weights and cardio a couple times a week Overall Feeling- Good Sleep- Awful can't fall asleep, takes a lot to get her to sleep some medication that is PM. Concerns- States that she has a metallic taste in her mouth, sides of the tongue feels raw  Colonoscopy- ok to order Mammogram- ok to order  Cervical Screening- yes (pap)  Declined all vaccines     Subjective    Claudia Moses is a 53 y.o. female who presents today for a complete physical exam.   Discussed the use of AI scribe software for clinical note transcription with the patient, who gave verbal consent to proceed.  History of Present Illness   Claudia Moses is a 53 year old female who presents for an annual physical exam following a recent episode of chest and jaw pain.  She experienced right-sided jaw pain followed by chest pain while driving last month. The pain lasted about twenty minutes and has not recurred. She has acid reflux, which she describes as a 'golf ball' sensation, but notes this episode felt different. She also has TMJ, which sometimes flares up with stress. Blood work and an EKG following the episode were normal.  Her cholesterol level is 209 mg/dL, showing improvement from five years ago. She wants to lose about ten pounds to help with her cholesterol levels.  She has a recent sensation of a raw mouth with a metallic taste, which is intermittent. Her mother mentioned thrush, but she has not observed any white patches. She wonders if it could be related to acidic foods.  She experiences occasional hip pain, suspected to be related to sciatica, but it is not significantly bothersome.  Her last Pap smear was in 2018, and she has not had a recent  mammogram. Her children encourage her to stay on top of health screenings, including mammograms and colonoscopies.  She had shingles at age 52 and is interested in receiving the shingles vaccine.        Last depression screening scores    10/18/2023    3:41 PM 09/20/2023    8:20 AM 01/14/2021    1:58 PM  PHQ 2/9 Scores  PHQ - 2 Score 0 0 0  PHQ- 9 Score 6 7 6    Last fall risk screening    10/18/2023    3:41 PM  Fall Risk   Falls in the past year? 1  Number falls in past yr: 0  Injury with Fall? 0        Medications: No outpatient medications prior to visit.   No facility-administered medications prior to visit.   The 10-year ASCVD risk score (Arnett DK, et al., 2019) is: 0.8%  Review of Systems    Objective    BP 111/71 (BP Location: Left Arm, Patient Position: Sitting, Cuff Size: Normal)   Pulse 66   Temp 98.4 F (36.9 C) (Oral)   Ht 5' 2.5 (1.588 m)   Wt 141 lb 14.4 oz (64.4 kg)   LMP  (LMP Unknown)   SpO2 99%   BMI 25.54 kg/m    Physical Exam Vitals reviewed.  Constitutional:      General: She is not in acute distress.  Appearance: Normal appearance. She is well-developed. She is not diaphoretic.  HENT:     Head: Normocephalic and atraumatic.     Right Ear: Tympanic membrane, ear canal and external ear normal.     Left Ear: Tympanic membrane, ear canal and external ear normal.     Nose: Nose normal.     Mouth/Throat:     Mouth: Mucous membranes are moist.     Pharynx: Oropharynx is clear. No oropharyngeal exudate.  Eyes:     General: No scleral icterus.    Conjunctiva/sclera: Conjunctivae normal.     Pupils: Pupils are equal, round, and reactive to light.  Neck:     Thyroid: No thyromegaly.  Cardiovascular:     Rate and Rhythm: Normal rate and regular rhythm.     Heart sounds: Normal heart sounds. No murmur heard. Pulmonary:     Effort: Pulmonary effort is normal. No respiratory distress.     Breath sounds: Normal breath sounds.  No wheezing or rales.  Abdominal:     General: There is no distension.     Palpations: Abdomen is soft.     Tenderness: There is no abdominal tenderness.  Genitourinary:    Comments: GYN:  External genitalia within normal limits.  Vaginal mucosa pink, moist, normal rugae.  Nonfriable cervix without lesions, no discharge or bleeding noted on speculum exam.    Chaperone present Musculoskeletal:        General: No deformity.     Cervical back: Neck supple.     Right lower leg: No edema.     Left lower leg: No edema.  Lymphadenopathy:     Cervical: No cervical adenopathy.  Skin:    General: Skin is warm and dry.     Findings: No rash.  Neurological:     Mental Status: She is alert and oriented to person, place, and time. Mental status is at baseline.     Gait: Gait normal.  Psychiatric:        Mood and Affect: Mood normal.        Behavior: Behavior normal.        Thought Content: Thought content normal.      No results found for any visits on 10/18/23.  Assessment & Plan    Routine Health Maintenance and Physical Exam  Exercise Activities and Dietary recommendations  Goals   None     Immunization History  Administered Date(s) Administered   Influenza Split 11/27/2009, 03/05/2011    Health Maintenance  Topic Date Due   HIV Screening  Never done   Hepatitis C Screening  Never done   DTaP/Tdap/Td (1 - Tdap) Never done   Hepatitis B Vaccines 19-59 Average Risk (1 of 3 - 19+ 3-dose series) Never done   Colonoscopy  Never done   Pneumococcal Vaccine: 50+ Years (1 of 1 - PCV) Never done   MAMMOGRAM  Never done   Zoster Vaccines- Shingrix  (1 of 2) Never done   Cervical Cancer Screening (HPV/Pap Cotest)  10/11/2021   COVID-19 Vaccine (1 - 2024-25 season) Never done   INFLUENZA VACCINE  05/22/2024 (Originally 09/23/2023)   HPV VACCINES  Aged Out   Meningococcal B Vaccine  Aged Out    Discussed health benefits of physical activity, and encouraged her to engage in regular  exercise appropriate for her age and condition.  Problem List Items Addressed This Visit   None Visit Diagnoses       Encounter for annual physical exam    -  Primary     Cervical cancer screening       Relevant Orders   Cytology - PAP     Colon cancer screening       Relevant Orders   Ambulatory referral to Gastroenterology     Breast cancer screening by mammogram       Relevant Orders   MM 3D SCREENING MAMMOGRAM BILATERAL BREAST           Adult Wellness Visit Routine adult wellness visit with a focus on regular health screenings and vaccinations. Previous lab work and screenings reviewed. Mammogram and colonoscopy screenings are due. Pap smear is slightly overdue but will be performed today. Vaccinations for pneumonia and shingles are planned, with a staggered schedule to minimize side effects. Discussed the need for hepatitis B, shingles, and tetanus vaccinations, with a plan to stagger them over future visits. - Order mammogram - Schedule colonoscopy with GI at [facility] - Perform Pap smear today - Administer pneumonia vaccine - Administer first shingles vaccine - Schedule second shingles vaccine in two months - Schedule next year's physical  Elevated cholesterol Cholesterol level at 209 mg/dL, slightly elevated from the desired level of 199 mg/dL. Previous cholesterol levels were higher, indicating improvement. ASCVD risk calculated at 0.8%, indicating low risk for heart disease. No immediate need for pharmacological intervention. Discussed weight loss and lifestyle modifications as potential methods to further reduce cholesterol levels.  Jaw and chest pain, resolved Previous episode of jaw and chest pain while driving, resolved without recurrence. Initial evaluation including EKG and blood work was normal. Pain was on the right side of the jaw, which is less concerning than left-sided pain. Possible differential includes TMJ or acid reflux. No further episodes  reported.  Oral discomfort with metallic taste, etiology unclear Intermittent oral discomfort with a metallic taste. No visible signs of thrush or B12 deficiency. Possible causes include dietary factors such as acidic foods. No recent COVID-19 infection reported. - Monitor dietary intake, particularly acidic foods  Hip pain, intermittent Intermittent hip pain, possibly related to sciatica. Not currently bothersome.        Return in about 2 months (around 12/18/2023) for 2nd shingrix  and 1st hep B vaccine and 63yr CPE.     Jon Eva, MD  [facility] [phone] (phone) [phone] (fax)  [facility]

## 2023-10-18 NOTE — Addendum Note (Signed)
 Addended by: TERREL POWELL CROME on: 10/18/2023 04:58 PM   Modules accepted: Orders

## 2023-10-20 ENCOUNTER — Other Ambulatory Visit (HOSPITAL_COMMUNITY): Payer: Self-pay | Admitting: Family Medicine

## 2023-10-20 DIAGNOSIS — Z1231 Encounter for screening mammogram for malignant neoplasm of breast: Secondary | ICD-10-CM

## 2023-10-21 ENCOUNTER — Ambulatory Visit: Payer: Self-pay | Admitting: Family Medicine

## 2023-10-21 LAB — CYTOLOGY - PAP
Comment: NEGATIVE
Diagnosis: NEGATIVE
High risk HPV: NEGATIVE

## 2023-10-26 ENCOUNTER — Ambulatory Visit (HOSPITAL_COMMUNITY)
Admission: RE | Admit: 2023-10-26 | Discharge: 2023-10-26 | Disposition: A | Discharge status: Home / Self Care | Source: Ambulatory Visit | Attending: Family Medicine | Admitting: Family Medicine

## 2023-10-26 ENCOUNTER — Encounter (HOSPITAL_COMMUNITY): Payer: Self-pay

## 2023-10-26 DIAGNOSIS — R928 Other abnormal and inconclusive findings on diagnostic imaging of breast: Secondary | ICD-10-CM | POA: Diagnosis not present

## 2023-10-26 DIAGNOSIS — Z1231 Encounter for screening mammogram for malignant neoplasm of breast: Secondary | ICD-10-CM | POA: Insufficient documentation

## 2023-10-31 ENCOUNTER — Other Ambulatory Visit (HOSPITAL_COMMUNITY): Payer: Self-pay | Admitting: Family Medicine

## 2023-10-31 DIAGNOSIS — R928 Other abnormal and inconclusive findings on diagnostic imaging of breast: Secondary | ICD-10-CM

## 2023-11-03 ENCOUNTER — Ambulatory Visit (HOSPITAL_COMMUNITY)
Admission: RE | Admit: 2023-11-03 | Discharge: 2023-11-03 | Disposition: A | Discharge status: Home / Self Care | Source: Ambulatory Visit | Attending: Family Medicine | Admitting: Family Medicine

## 2023-11-03 DIAGNOSIS — R92322 Mammographic fibroglandular density, left breast: Secondary | ICD-10-CM | POA: Diagnosis not present

## 2023-11-03 DIAGNOSIS — R928 Other abnormal and inconclusive findings on diagnostic imaging of breast: Secondary | ICD-10-CM | POA: Diagnosis not present

## 2023-11-04 DIAGNOSIS — Z419 Encounter for procedure for purposes other than remedying health state, unspecified: Secondary | ICD-10-CM | POA: Diagnosis not present

## 2023-12-19 ENCOUNTER — Ambulatory Visit (INDEPENDENT_AMBULATORY_CARE_PROVIDER_SITE_OTHER): Admitting: Family Medicine

## 2023-12-19 ENCOUNTER — Encounter: Payer: Self-pay | Admitting: Family Medicine

## 2023-12-19 DIAGNOSIS — Z23 Encounter for immunization: Secondary | ICD-10-CM

## 2023-12-19 NOTE — Progress Notes (Signed)
 Patient here for Hep B and Shingrix  vaccination only.  I did not examine the patient.  I did review her medical history, medications, and allergies and vaccine consent form.  CMA gave vaccination. Patient tolerated well.  Myrla Jon HERO, MD, MPH [facility] 12/19/2023 3:23 PM

## 2024-01-24 ENCOUNTER — Ambulatory Visit: Admitting: Family Medicine

## 2024-02-03 DIAGNOSIS — Z419 Encounter for procedure for purposes other than remedying health state, unspecified: Secondary | ICD-10-CM | POA: Diagnosis not present

## 2024-10-22 ENCOUNTER — Encounter: Admitting: Family Medicine
